# Patient Record
Sex: Male | Born: 1980 | Race: Black or African American | Hispanic: No | Marital: Single | Smoking: Current every day smoker
Health system: Southern US, Community
[De-identification: ages and names within clinical notes are randomized; demographics above are authoritative.]

## PROBLEM LIST (undated history)

## (undated) DIAGNOSIS — I10 Essential (primary) hypertension: Secondary | ICD-10-CM

## (undated) DIAGNOSIS — E785 Hyperlipidemia, unspecified: Secondary | ICD-10-CM

---

## 2005-11-24 ENCOUNTER — Emergency Department: Payer: Self-pay | Admitting: Emergency Medicine

## 2006-04-23 ENCOUNTER — Other Ambulatory Visit: Payer: Self-pay

## 2006-04-23 ENCOUNTER — Emergency Department: Payer: Self-pay | Admitting: Emergency Medicine

## 2006-07-05 ENCOUNTER — Emergency Department: Payer: Self-pay | Admitting: General Practice

## 2006-07-05 ENCOUNTER — Other Ambulatory Visit: Payer: Self-pay

## 2007-05-10 ENCOUNTER — Emergency Department: Payer: Self-pay | Admitting: Emergency Medicine

## 2007-05-13 ENCOUNTER — Other Ambulatory Visit: Payer: Self-pay

## 2007-05-13 ENCOUNTER — Emergency Department: Payer: Self-pay | Admitting: Emergency Medicine

## 2007-07-23 ENCOUNTER — Emergency Department: Payer: Self-pay | Admitting: Emergency Medicine

## 2007-11-20 ENCOUNTER — Emergency Department: Payer: Self-pay | Admitting: Emergency Medicine

## 2008-02-02 ENCOUNTER — Emergency Department: Payer: Self-pay | Admitting: Emergency Medicine

## 2008-05-18 ENCOUNTER — Emergency Department: Payer: Self-pay | Admitting: Unknown Physician Specialty

## 2008-10-26 ENCOUNTER — Emergency Department: Payer: Self-pay | Admitting: Unknown Physician Specialty

## 2011-04-17 ENCOUNTER — Emergency Department: Payer: Self-pay | Admitting: Emergency Medicine

## 2011-06-04 ENCOUNTER — Emergency Department: Payer: Self-pay | Admitting: *Deleted

## 2011-10-05 ENCOUNTER — Emergency Department: Payer: Self-pay | Admitting: Emergency Medicine

## 2011-10-05 LAB — BASIC METABOLIC PANEL
BUN: 10 mg/dL (ref 7–18)
Calcium, Total: 8.6 mg/dL (ref 8.5–10.1)
Chloride: 104 mmol/L (ref 98–107)
Creatinine: 0.97 mg/dL (ref 0.60–1.30)
EGFR (African American): >60
EGFR (Non-African Amer.): >60
Glucose: 85 mg/dL (ref 65–99)
Osmolality: 278 (ref 275–301)
Potassium: 4 mmol/L (ref 3.5–5.1)
Sodium: 140 mmol/L (ref 136–145)

## 2011-10-05 LAB — CBC
MCH: 29.5 pg (ref 26.0–34.0)
MCHC: 32.6 g/dL (ref 32.0–36.0)
MCV: 91 fL (ref 80–100)
Platelet: 202 10*3/uL (ref 150–440)
WBC: 11.5 10*3/uL — ABNORMAL HIGH (ref 3.8–10.6)

## 2011-10-05 LAB — HEPATIC FUNCTION PANEL A (ARMC)
Alkaline Phosphatase: 78 U/L (ref 50–136)
Bilirubin, Direct: 0.1 mg/dL (ref 0.00–0.20)
SGOT(AST): 22 U/L (ref 15–37)
Total Protein: 7.5 g/dL (ref 6.4–8.2)

## 2011-10-05 LAB — URINALYSIS, COMPLETE
Bilirubin,UR: NEGATIVE
Glucose,UR: NEGATIVE mg/dL (ref 0–75)
Nitrite: NEGATIVE
Squamous Epithelial: NONE SEEN
WBC UR: 2 /HPF (ref 0–5)

## 2011-10-05 LAB — TROPONIN I: Troponin-I: <0.02 ng/mL

## 2011-10-05 LAB — LIPASE, BLOOD: Lipase: 103 U/L (ref 73–393)

## 2012-05-31 ENCOUNTER — Emergency Department: Payer: Self-pay | Admitting: Emergency Medicine

## 2012-05-31 LAB — RAPID INFLUENZA A&B ANTIGENS

## 2012-06-01 ENCOUNTER — Emergency Department: Payer: Self-pay | Admitting: Emergency Medicine

## 2012-06-02 LAB — BETA STREP CULTURE(ARMC)

## 2012-09-16 ENCOUNTER — Emergency Department: Payer: Self-pay | Admitting: Emergency Medicine

## 2012-09-16 LAB — BASIC METABOLIC PANEL
Anion Gap: 4 — ABNORMAL LOW (ref 7–16)
BUN: 15 mg/dL (ref 7–18)
Creatinine: 0.87 mg/dL (ref 0.60–1.30)
EGFR (Non-African Amer.): >60
Glucose: 93 mg/dL (ref 65–99)
Osmolality: 280 (ref 275–301)

## 2012-09-16 LAB — CBC
MCH: 29.3 pg (ref 26.0–34.0)
MCHC: 34.1 g/dL (ref 32.0–36.0)
Platelet: 207 10*3/uL (ref 150–440)
RBC: 4.96 10*6/uL (ref 4.40–5.90)
RDW: 13.7 % (ref 11.5–14.5)
WBC: 10.2 10*3/uL (ref 3.8–10.6)

## 2012-09-17 LAB — CK TOTAL AND CKMB (NOT AT ARMC): CK, Total: 258 U/L — ABNORMAL HIGH (ref 35–232)

## 2012-09-17 LAB — TROPONIN I: Troponin-I: <0.02 ng/mL

## 2012-10-09 ENCOUNTER — Emergency Department: Payer: Self-pay | Admitting: Emergency Medicine

## 2013-01-08 ENCOUNTER — Emergency Department: Payer: Self-pay | Admitting: Emergency Medicine

## 2013-01-08 LAB — CBC
HCT: 45.5 % (ref 40.0–52.0)
MCHC: 33.9 g/dL (ref 32.0–36.0)
MCV: 88 fL (ref 80–100)
Platelet: 191 10*3/uL (ref 150–440)
RBC: 5.18 10*6/uL (ref 4.40–5.90)

## 2013-01-08 LAB — BASIC METABOLIC PANEL
Anion Gap: 4 — ABNORMAL LOW (ref 7–16)
BUN: 12 mg/dL (ref 7–18)
Chloride: 108 mmol/L — ABNORMAL HIGH (ref 98–107)
Co2: 27 mmol/L (ref 21–32)
EGFR (African American): >60
EGFR (Non-African Amer.): >60

## 2013-01-08 LAB — TROPONIN I: Troponin-I: <0.02 ng/mL

## 2013-06-20 ENCOUNTER — Emergency Department: Payer: Self-pay | Admitting: Emergency Medicine

## 2013-06-20 LAB — TROPONIN I: Troponin-I: <0.02 ng/mL

## 2013-06-20 LAB — URINALYSIS, COMPLETE
Bacteria: NONE SEEN
Bilirubin,UR: NEGATIVE
Blood: NEGATIVE
Glucose,UR: NEGATIVE mg/dL (ref 0–75)
Ketone: NEGATIVE
Leukocyte Esterase: NEGATIVE
Nitrite: NEGATIVE
PH: 6 (ref 4.5–8.0)
PROTEIN: NEGATIVE
Specific Gravity: 1.024 (ref 1.003–1.030)
Squamous Epithelial: NONE SEEN
WBC UR: <1 /HPF (ref 0–5)

## 2013-06-20 LAB — CBC WITH DIFFERENTIAL/PLATELET
Basophil #: 0.1 10*3/uL (ref 0.0–0.1)
Basophil %: 0.9 %
Eosinophil #: 0.5 10*3/uL (ref 0.0–0.7)
Eosinophil %: 5.3 %
HCT: 44.9 % (ref 40.0–52.0)
HGB: 14.9 g/dL (ref 13.0–18.0)
LYMPHS ABS: 3.4 10*3/uL (ref 1.0–3.6)
LYMPHS PCT: 35.9 %
MCH: 29.5 pg (ref 26.0–34.0)
MCHC: 33.3 g/dL (ref 32.0–36.0)
MCV: 89 fL (ref 80–100)
MONOS PCT: 12.4 %
Monocyte #: 1.2 x10 3/mm — ABNORMAL HIGH (ref 0.2–1.0)
NEUTROS ABS: 4.3 10*3/uL (ref 1.4–6.5)
Neutrophil %: 45.5 %
Platelet: 192 10*3/uL (ref 150–440)
RBC: 5.07 10*6/uL (ref 4.40–5.90)
RDW: 13.4 % (ref 11.5–14.5)
WBC: 9.4 10*3/uL (ref 3.8–10.6)

## 2013-06-20 LAB — COMPREHENSIVE METABOLIC PANEL
ALK PHOS: 101 U/L
ANION GAP: 4 — AB (ref 7–16)
Albumin: 3.8 g/dL (ref 3.4–5.0)
BUN: 12 mg/dL (ref 7–18)
Bilirubin,Total: 0.4 mg/dL (ref 0.2–1.0)
CALCIUM: 9 mg/dL (ref 8.5–10.1)
CHLORIDE: 103 mmol/L (ref 98–107)
CO2: 29 mmol/L (ref 21–32)
Creatinine: 1 mg/dL (ref 0.60–1.30)
EGFR (African American): >60
Glucose: 121 mg/dL — ABNORMAL HIGH (ref 65–99)
Osmolality: 273 (ref 275–301)
Potassium: 3.7 mmol/L (ref 3.5–5.1)
SGOT(AST): 24 U/L (ref 15–37)
SGPT (ALT): 36 U/L (ref 12–78)
Sodium: 136 mmol/L (ref 136–145)
TOTAL PROTEIN: 7.2 g/dL (ref 6.4–8.2)

## 2013-06-20 LAB — LIPASE, BLOOD: Lipase: 140 U/L (ref 73–393)

## 2014-03-23 ENCOUNTER — Emergency Department: Payer: Self-pay | Admitting: Emergency Medicine

## 2015-03-25 ENCOUNTER — Encounter: Payer: Self-pay | Admitting: Emergency Medicine

## 2015-03-25 ENCOUNTER — Emergency Department
Admission: EM | Admit: 2015-03-25 | Discharge: 2015-03-26 | Disposition: A | Discharge status: Home / Self Care | Payer: BLUE CROSS/BLUE SHIELD | Attending: Emergency Medicine | Admitting: Emergency Medicine

## 2015-03-25 DIAGNOSIS — K649 Unspecified hemorrhoids: Secondary | ICD-10-CM | POA: Diagnosis not present

## 2015-03-25 DIAGNOSIS — I10 Essential (primary) hypertension: Secondary | ICD-10-CM | POA: Diagnosis not present

## 2015-03-25 DIAGNOSIS — Z720 Tobacco use: Secondary | ICD-10-CM | POA: Diagnosis not present

## 2015-03-25 DIAGNOSIS — R109 Unspecified abdominal pain: Secondary | ICD-10-CM | POA: Diagnosis present

## 2015-03-25 HISTORY — DX: Essential (primary) hypertension: I10

## 2015-03-25 LAB — COMPREHENSIVE METABOLIC PANEL
ALT: 34 U/L (ref 17–63)
ANION GAP: 5 (ref 5–15)
AST: 23 U/L (ref 15–41)
Albumin: 4.5 g/dL (ref 3.5–5.0)
Alkaline Phosphatase: 74 U/L (ref 38–126)
BILIRUBIN TOTAL: 0.7 mg/dL (ref 0.3–1.2)
BUN: 13 mg/dL (ref 6–20)
CO2: 26 mmol/L (ref 22–32)
Calcium: 9 mg/dL (ref 8.9–10.3)
Chloride: 105 mmol/L (ref 101–111)
Creatinine, Ser: 0.94 mg/dL (ref 0.61–1.24)
GFR calc Af Amer: >60 mL/min (ref >60)
Glucose, Bld: 106 mg/dL — ABNORMAL HIGH (ref 65–99)
POTASSIUM: 3.9 mmol/L (ref 3.5–5.1)
Sodium: 136 mmol/L (ref 135–145)
TOTAL PROTEIN: 7.6 g/dL (ref 6.5–8.1)

## 2015-03-25 LAB — URINALYSIS COMPLETE WITH MICROSCOPIC (ARMC ONLY)
Bacteria, UA: NONE SEEN
Bilirubin Urine: NEGATIVE
GLUCOSE, UA: NEGATIVE mg/dL
Hgb urine dipstick: NEGATIVE
KETONES UR: NEGATIVE mg/dL
Leukocytes, UA: NEGATIVE
Nitrite: NEGATIVE
PROTEIN: NEGATIVE mg/dL
SQUAMOUS EPITHELIAL / LPF: NONE SEEN
Specific Gravity, Urine: 1.02 (ref 1.005–1.030)
pH: 6 (ref 5.0–8.0)

## 2015-03-25 LAB — CBC
HCT: 47.4 % (ref 40.0–52.0)
Hemoglobin: 15.9 g/dL (ref 13.0–18.0)
MCH: 29.4 pg (ref 26.0–34.0)
MCHC: 33.6 g/dL (ref 32.0–36.0)
MCV: 87.6 fL (ref 80.0–100.0)
PLATELETS: 190 10*3/uL (ref 150–440)
RBC: 5.41 MIL/uL (ref 4.40–5.90)
RDW: 13.5 % (ref 11.5–14.5)
WBC: 9.8 10*3/uL (ref 3.8–10.6)

## 2015-03-25 LAB — LIPASE, BLOOD: Lipase: 28 U/L (ref 11–51)

## 2015-03-25 NOTE — ED Notes (Signed)
Patient reports left lower abdominal pain since Thursday.  Reports noticed blood in stool x1 yesterday.

## 2015-03-26 MED ORDER — LIDOCAINE HCL 2 % EX GEL
1.0000 "application " | Freq: Once | CUTANEOUS | Status: AC
  Administered 2015-03-26: 1 via TOPICAL

## 2015-03-26 NOTE — ED Notes (Signed)
Documentation on downtime forms.

## 2015-03-26 NOTE — ED Provider Notes (Signed)
[facility] Emergency Department Provider Note  ____________________________________________  Time seen: Approximately 0036 AM  I have reviewed the triage vital signs and the nursing notes.   HISTORY  Chief Complaint Abdominal Pain    HPI Jerry Anderson is a 34 y.o. male who comes into the hospital today with pain in his rectum. The patient reports that it started yesterday. He went to use the bathroom and noticed that he had blood in his stool. The patient reports that he has a little pain in his left side. He reports that he had to strain to use the bathroom. He denies a history of hemorrhoids. The patient is a bathroom and didn't notice any blood today but reports these having some worse pain. He did not take anything for pain at home. He rates his pain as a 10 out of 10 in intensity in his rectum and a 6-7 out of 10 in intensity and is left side. The patient decided to come in for evaluation as he was unsure what going on. The patient denies any diarrhea and does not think he is constipated. He does not have any chest pain or shortness of breath no fever or vomiting.   Past Medical History  Diagnosis Date  . Hypertension     There are no active problems to display for this patient.   History reviewed. No pertinent past surgical history.  No current outpatient prescriptions on file.  Allergies Review of patient's allergies indicates no known allergies.  No family history on file.  Social History Social History  Substance Use Topics  . Smoking status: Current Every Day Smoker -- 0.50 packs/day    Types: Cigarettes  . Smokeless tobacco: Never Used  . Alcohol Use: Yes    Review of Systems Constitutional: No fever/chills Eyes: No visual changes. ENT: No sore throat. Cardiovascular: Denies chest pain. Respiratory: Denies shortness of breath. Gastrointestinal: abdominal pain and rectal pain  No nausea, no vomiting.  No diarrhea.  No  constipation. Genitourinary: Negative for dysuria. Musculoskeletal: Negative for back pain. Skin: Negative for rash. Neurological: Negative for headaches, focal weakness or numbness.  10-point ROS otherwise negative.  ____________________________________________   PHYSICAL EXAM:  VITAL SIGNS: ED Triage Vitals  Enc Vitals Group     BP 03/25/15 2307 168/98 mmHg     Pulse Rate 03/25/15 2307 84     Resp 03/25/15 2307 18     Temp 03/25/15 2307 98.6 F (37 C)     Temp Source 03/25/15 2307 Oral     SpO2 03/25/15 2307 100 %     Weight 03/25/15 2307 225 lb (102.059 kg)     Height 03/25/15 2307  (1.702 m)     Head Cir --      Peak Flow --      Pain Score 03/25/15 2308 6     Pain Loc --      Pain Edu? --      Excl. in GC? --     Constitutional: Alert and oriented. Well appearing and in no acute distress. Eyes: Conjunctivae are normal. PERRL. EOMI. Head: Atraumatic. Nose: No congestion/rhinnorhea. Mouth/Throat: Mucous membranes are moist.  Oropharynx non-erythematous. Neck: No stridor.  Cardiovascular: Normal rate, regular rhythm. Grossly normal heart sounds.  Good peripheral circulation. Respiratory: Normal respiratory effort.  No retractions. Lungs CTAB. Gastrointestinal: Soft and nontender. No distention. Positive bowel sounds Rectal: hemrrohoids noted on rectal exam. Patient refused DRE Musculoskeletal: No lower extremity tenderness nor edema.   Neurologic:  Normal speech and language. No gross focal neurologic deficits are appreciated.  Skin:  Skin is warm, dry and intact. Psychiatric: Mood and affect are normal.   ____________________________________________   LABS (all labs ordered are listed, but only abnormal results are displayed)  Labs Reviewed  COMPREHENSIVE METABOLIC PANEL - Abnormal; Notable for the following:    Glucose, Bld 106 (*)    All other components within normal limits  URINALYSIS COMPLETEWITH MICROSCOPIC ([facility] ONLY) - Abnormal; Notable for  the following:    Color, Urine YELLOW (*)    APPearance CLEAR (*)    All other components within normal limits  LIPASE, BLOOD  CBC   ____________________________________________  EKG  none ____________________________________________  RADIOLOGY  none ____________________________________________   PROCEDURES  Procedure(s) performed: None  Critical Care performed: No  ____________________________________________   INITIAL IMPRESSION / ASSESSMENT AND PLAN / ED COURSE  Pertinent labs & imaging results that were available during my care of the patient were reviewed by me and considered in my medical decision making (see chart for details).  This is a 34 year old male who comes in today with some rectal pain. The patient reports he was straining to have a bowel movement and does appear to have hemorrhoids on exam. The patient would not allow me to do a digital rectal exam to check for blood because he did not feel it was necessary. I informed the patient of the things to help with his hemorrhoids such as over-the-counter Preparation H cream, Metamucil, Colace and other stool softening and bulking agents. The patient did get some lidocaine jelly to his rectum to help with his pain and he will be discharged to home. The patient reports that his abdominal pain is minimal and does not what he is concerned about at this time. ____________________________________________   FINAL CLINICAL IMPRESSION(S) / ED DIAGNOSES  Final diagnoses:  Hemorrhoids, unspecified hemorrhoid type      Rebecka ApleyAllison P Webster, MD 03/26/15 0825

## 2016-04-23 ENCOUNTER — Emergency Department: Payer: BLUE CROSS/BLUE SHIELD

## 2016-04-23 ENCOUNTER — Emergency Department
Admission: EM | Admit: 2016-04-23 | Discharge: 2016-04-23 | Disposition: A | Discharge status: Home / Self Care | Payer: BLUE CROSS/BLUE SHIELD | Attending: Emergency Medicine | Admitting: Emergency Medicine

## 2016-04-23 ENCOUNTER — Encounter: Payer: Self-pay | Admitting: *Deleted

## 2016-04-23 DIAGNOSIS — J069 Acute upper respiratory infection, unspecified: Secondary | ICD-10-CM | POA: Insufficient documentation

## 2016-04-23 DIAGNOSIS — I10 Essential (primary) hypertension: Secondary | ICD-10-CM | POA: Insufficient documentation

## 2016-04-23 DIAGNOSIS — R1012 Left upper quadrant pain: Secondary | ICD-10-CM | POA: Insufficient documentation

## 2016-04-23 DIAGNOSIS — R05 Cough: Secondary | ICD-10-CM | POA: Diagnosis present

## 2016-04-23 DIAGNOSIS — F17210 Nicotine dependence, cigarettes, uncomplicated: Secondary | ICD-10-CM | POA: Insufficient documentation

## 2016-04-23 LAB — COMPREHENSIVE METABOLIC PANEL
ALBUMIN: 4.4 g/dL (ref 3.5–5.0)
ALK PHOS: 75 U/L (ref 38–126)
ALT: 40 U/L (ref 17–63)
AST: 27 U/L (ref 15–41)
Anion gap: 6 (ref 5–15)
BILIRUBIN TOTAL: 0.5 mg/dL (ref 0.3–1.2)
BUN: 11 mg/dL (ref 6–20)
CO2: 27 mmol/L (ref 22–32)
Calcium: 9.1 mg/dL (ref 8.9–10.3)
Chloride: 107 mmol/L (ref 101–111)
Creatinine, Ser: 0.89 mg/dL (ref 0.61–1.24)
GFR calc Af Amer: >60 mL/min (ref >60)
GFR calc non Af Amer: >60 mL/min (ref >60)
GLUCOSE: 101 mg/dL — AB (ref 65–99)
POTASSIUM: 3.7 mmol/L (ref 3.5–5.1)
SODIUM: 140 mmol/L (ref 135–145)
TOTAL PROTEIN: 7.4 g/dL (ref 6.5–8.1)

## 2016-04-23 LAB — URINALYSIS, COMPLETE (UACMP) WITH MICROSCOPIC
BACTERIA UA: NONE SEEN
Bilirubin Urine: NEGATIVE
Glucose, UA: NEGATIVE mg/dL
HGB URINE DIPSTICK: NEGATIVE
Ketones, ur: NEGATIVE mg/dL
Leukocytes, UA: NEGATIVE
NITRITE: NEGATIVE
PROTEIN: NEGATIVE mg/dL
SPECIFIC GRAVITY, URINE: 1.017 (ref 1.005–1.030)
SQUAMOUS EPITHELIAL / LPF: NONE SEEN
pH: 6 (ref 5.0–8.0)

## 2016-04-23 LAB — CBC
HEMATOCRIT: 42.3 % (ref 40.0–52.0)
Hemoglobin: 14.6 g/dL (ref 13.0–18.0)
MCH: 30.2 pg (ref 26.0–34.0)
MCHC: 34.5 g/dL (ref 32.0–36.0)
MCV: 87.4 fL (ref 80.0–100.0)
Platelets: 182 10*3/uL (ref 150–440)
RBC: 4.84 MIL/uL (ref 4.40–5.90)
RDW: 13.6 % (ref 11.5–14.5)
WBC: 8.9 10*3/uL (ref 3.8–10.6)

## 2016-04-23 LAB — LIPASE, BLOOD: Lipase: 25 U/L (ref 11–51)

## 2016-04-23 MED ORDER — TRAMADOL HCL 50 MG PO TABS: 50.0000 mg | ORAL_TABLET | Freq: Four times a day (QID) | ORAL | 0 refills | Status: AC | PRN

## 2016-04-23 MED ORDER — PREDNISONE 20 MG PO TABS: 40.0000 mg | ORAL_TABLET | Freq: Every day | ORAL | 0 refills | Status: DC

## 2016-04-23 MED ORDER — HYDROCHLOROTHIAZIDE 25 MG PO TABS: 25.0000 mg | ORAL_TABLET | Freq: Every day | ORAL | 2 refills | Status: DC
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
## 2016-04-23 NOTE — ED Notes (Signed)
Discharge instructions reviewed with patient. Patient verbalized understanding. Patient ambulated to lobby without difficulty.   

## 2016-12-08 ENCOUNTER — Emergency Department: Payer: BLUE CROSS/BLUE SHIELD

## 2016-12-08 ENCOUNTER — Encounter: Payer: Self-pay | Admitting: Emergency Medicine

## 2016-12-08 DIAGNOSIS — R079 Chest pain, unspecified: Secondary | ICD-10-CM | POA: Insufficient documentation

## 2016-12-08 DIAGNOSIS — R61 Generalized hyperhidrosis: Secondary | ICD-10-CM | POA: Insufficient documentation

## 2016-12-08 DIAGNOSIS — R112 Nausea with vomiting, unspecified: Secondary | ICD-10-CM | POA: Diagnosis not present

## 2016-12-08 DIAGNOSIS — Z5321 Procedure and treatment not carried out due to patient leaving prior to being seen by health care provider: Secondary | ICD-10-CM | POA: Insufficient documentation

## 2016-12-08 LAB — CBC
HCT: 45.2 % (ref 40.0–52.0)
Hemoglobin: 15.1 g/dL (ref 13.0–18.0)
MCH: 29.2 pg (ref 26.0–34.0)
MCHC: 33.4 g/dL (ref 32.0–36.0)
MCV: 87.3 fL (ref 80.0–100.0)
PLATELETS: 211 10*3/uL (ref 150–440)
RBC: 5.18 MIL/uL (ref 4.40–5.90)
RDW: 13.7 % (ref 11.5–14.5)
WBC: 12.2 10*3/uL — AB (ref 3.8–10.6)

## 2016-12-08 LAB — BASIC METABOLIC PANEL
Anion gap: 10 (ref 5–15)
BUN: 9 mg/dL (ref 6–20)
CO2: 25 mmol/L (ref 22–32)
CREATININE: 0.91 mg/dL (ref 0.61–1.24)
Calcium: 9.2 mg/dL (ref 8.9–10.3)
Chloride: 104 mmol/L (ref 101–111)
GFR calc Af Amer: >60 mL/min (ref >60)
GLUCOSE: 88 mg/dL (ref 65–99)
Potassium: 3.5 mmol/L (ref 3.5–5.1)
SODIUM: 139 mmol/L (ref 135–145)

## 2016-12-08 LAB — TROPONIN I: Troponin I: <0.03 ng/mL (ref <0.03)

## 2016-12-08 NOTE — ED Notes (Signed)
Patient to stat desk. Patient updated on wait time.

## 2016-12-08 NOTE — ED Triage Notes (Signed)
Pt states left sided chest pain that began approx one hour pta. Pt states pain radiates to left anterior shoulder, accompanied by nausea, emesis x1 and sweating. Pt appears in no acute distress in triage. Pt states pain is worse with inspiration.

## 2016-12-09 ENCOUNTER — Emergency Department
Admission: EM | Admit: 2016-12-09 | Discharge: 2016-12-09 | Disposition: A | Discharge status: Home / Self Care | Payer: BLUE CROSS/BLUE SHIELD | Attending: Emergency Medicine | Admitting: Emergency Medicine

## 2016-12-10 ENCOUNTER — Telehealth: Payer: Self-pay | Admitting: Emergency Medicine

## 2016-12-10 NOTE — Telephone Encounter (Signed)
Called patient due to lwot to inquire about condition and follow up plans.; Busy signal.

## 2016-12-23 ENCOUNTER — Encounter: Payer: Self-pay | Admitting: Medical Oncology

## 2016-12-23 ENCOUNTER — Emergency Department
Admission: EM | Admit: 2016-12-23 | Discharge: 2016-12-23 | Disposition: A | Discharge status: Home / Self Care | Payer: BLUE CROSS/BLUE SHIELD | Attending: Emergency Medicine | Admitting: Emergency Medicine

## 2016-12-23 ENCOUNTER — Emergency Department: Payer: BLUE CROSS/BLUE SHIELD

## 2016-12-23 DIAGNOSIS — R42 Dizziness and giddiness: Secondary | ICD-10-CM | POA: Insufficient documentation

## 2016-12-23 DIAGNOSIS — F17210 Nicotine dependence, cigarettes, uncomplicated: Secondary | ICD-10-CM | POA: Diagnosis not present

## 2016-12-23 DIAGNOSIS — I10 Essential (primary) hypertension: Secondary | ICD-10-CM | POA: Insufficient documentation

## 2016-12-23 DIAGNOSIS — Z79899 Other long term (current) drug therapy: Secondary | ICD-10-CM | POA: Insufficient documentation

## 2016-12-23 LAB — COMPREHENSIVE METABOLIC PANEL
ALT: 22 U/L (ref 17–63)
ANION GAP: 10 (ref 5–15)
AST: 21 U/L (ref 15–41)
Albumin: 4.4 g/dL (ref 3.5–5.0)
Alkaline Phosphatase: 70 U/L (ref 38–126)
BILIRUBIN TOTAL: 1.2 mg/dL (ref 0.3–1.2)
BUN: 13 mg/dL (ref 6–20)
CALCIUM: 9.8 mg/dL (ref 8.9–10.3)
CO2: 26 mmol/L (ref 22–32)
Chloride: 103 mmol/L (ref 101–111)
Creatinine, Ser: 0.84 mg/dL (ref 0.61–1.24)
GFR calc non Af Amer: >60 mL/min (ref >60)
Glucose, Bld: 106 mg/dL — ABNORMAL HIGH (ref 65–99)
Potassium: 3.6 mmol/L (ref 3.5–5.1)
Sodium: 139 mmol/L (ref 135–145)
Total Protein: 7.6 g/dL (ref 6.5–8.1)

## 2016-12-23 LAB — CBC WITH DIFFERENTIAL/PLATELET
Basophils Absolute: 0.1 10*3/uL (ref 0–0.1)
Basophils Relative: 2 %
Eosinophils Absolute: 0.2 10*3/uL (ref 0–0.7)
Eosinophils Relative: 3 %
HCT: 45.3 % (ref 40.0–52.0)
HEMOGLOBIN: 15.5 g/dL (ref 13.0–18.0)
LYMPHS ABS: 2.3 10*3/uL (ref 1.0–3.6)
LYMPHS PCT: 30 %
MCH: 29.5 pg (ref 26.0–34.0)
MCHC: 34.3 g/dL (ref 32.0–36.0)
MCV: 86 fL (ref 80.0–100.0)
MONOS PCT: 9 %
Monocytes Absolute: 0.7 10*3/uL (ref 0.2–1.0)
NEUTROS PCT: 56 %
Neutro Abs: 4.4 10*3/uL (ref 1.4–6.5)
Platelets: 205 10*3/uL (ref 150–440)
RBC: 5.27 MIL/uL (ref 4.40–5.90)
RDW: 13.7 % (ref 11.5–14.5)
WBC: 7.7 10*3/uL (ref 3.8–10.6)

## 2016-12-23 LAB — TROPONIN I

## 2016-12-23 MED ORDER — ONDANSETRON HCL 4 MG/2ML IJ SOLN
4.0000 mg | Freq: Once | INTRAMUSCULAR | Status: AC
  Administered 2016-12-23: 4 mg via INTRAVENOUS
  Filled 2016-12-23: qty 2

## 2016-12-23 MED ORDER — SODIUM CHLORIDE 0.9 % IV SOLN
Freq: Once | INTRAVENOUS | Status: AC
Start: 2016-12-23 — End: 2016-12-23
  Administered 2016-12-23: 12:00:00 via INTRAVENOUS

## 2016-12-23 MED ORDER — HYDROCHLOROTHIAZIDE 25 MG PO TABS: 25.0000 mg | ORAL_TABLET | Freq: Every day | ORAL | 1 refills | Status: DC

## 2016-12-23 MED ORDER — ONDANSETRON 4 MG PO TBDP: 4.0000 mg | ORAL_TABLET | Freq: Three times a day (TID) | ORAL | 0 refills | Status: DC | PRN

## 2016-12-23 NOTE — ED Provider Notes (Addendum)
[facility] Emergency Department Provider Note       Time seen: ----------------------------------------- 11:18 AM on 12/23/2016 -----------------------------------------     I have reviewed the triage vital signs and the nursing notes.   HISTORY   Chief Complaint Dizziness    HPI Jerry Anderson is a 36 y.o. male who presents to the ED for dizziness and seeing black spots. Patient states he was also having some chest pressure. Patient states his been out of his blood pressure medicines for a couple months, he had been taking hydrochlorothiazide. Patient has been vomiting, denies other complaints at this time.   Past Medical History:  Diagnosis Date  . Hypertension     There are no active problems to display for this patient.   No past surgical history on file.  Allergies Patient has no known allergies.  Social History Social History  Substance Use Topics  . Smoking status: Current Every Day Smoker    Packs/day: 0.50    Types: Cigarettes  . Smokeless tobacco: Never Used  . Alcohol use Yes    Review of Systems Constitutional: Negative for fever. Cardiovascular: Negative for chest pain. Respiratory: Negative for shortness of breath. Gastrointestinal: Negative for abdominal pain,Positive for vomiting Genitourinary: Negative for dysuria. Musculoskeletal: Negative for back pain. Skin: Negative for rash. Neurological: Negative for headaches, focal weakness or numbness. Positive for dizziness  All systems negative/normal/unremarkable except as stated in the HPI  ____________________________________________   PHYSICAL EXAM:  VITAL SIGNS: ED Triage Vitals  Enc Vitals Group     BP 12/23/16 1059 (!) 172/97     Pulse Rate 12/23/16 1059 97     Resp 12/23/16 1059 18     Temp 12/23/16 1059 99.1 F (37.3 C)     Temp Source 12/23/16 1059 Oral     SpO2 12/23/16 1059 96 %     Weight 12/23/16 1059 225 lb (102.1 kg)     Height 12/23/16  1059 5\' 7"  (1.702 m)     Head Circumference --      Peak Flow --      Pain Score 12/23/16 1058 9     Pain Loc --      Pain Edu? --      Excl. in GC? --    Constitutional: Alert and oriented. Well appearing and in no distress. Eyes: Conjunctivae are normal. Normal extraocular movements. ENT   Head: Normocephalic and atraumatic.   Nose: No congestion/rhinnorhea.   Mouth/Throat: Mucous membranes are moist.   Neck: No stridor. Cardiovascular: Normal rate, regular rhythm. No murmurs, rubs, or gallops. Respiratory: Normal respiratory effort without tachypnea nor retractions. Breath sounds are clear and equal bilaterally. No wheezes/rales/rhonchi. Gastrointestinal: Soft and nontender. Normal bowel sounds Musculoskeletal: Nontender with normal range of motion in extremities. No lower extremity tenderness nor edema. Neurologic:  Normal speech and language. No gross focal neurologic deficits are appreciated.  Skin:  Skin is warm, dry and intact. No rash noted. Psychiatric: Mood and affect are normal. Speech and behavior are normal.  ____________________________________________  EKG: Interpreted by me.Sinus rhythm rate 87 bpm, normal PR interval, normal QRS, normal QT. Nonspecific T-wave changes Which are old  ____________________________________________  ED COURSE:  Pertinent labs & imaging results that were available during my care of the patient were reviewed by me and considered in my medical decision making (see chart for details). Patient presents for dizziness, we will assess with labs and imaging as indicated.   Procedures ____________________________________________   LABS (pertinent positives/negatives)  Labs Reviewed  COMPREHENSIVE METABOLIC PANEL - Abnormal; Notable for the following:       Result Value   Glucose, Bld 106 (*)    All other components within normal limits  CBC WITH DIFFERENTIAL/PLATELET  TROPONIN I  CBG MONITORING, ED    RADIOLOGY  Chest  x-ray  IMPRESSION: No active cardiopulmonary disease.  ____________________________________________  FINAL ASSESSMENT AND PLAN  Dizziness  Plan: Patient's labs and imaging were dictated above. Patient had presented for dizziness of uncertain etiology. He was given fluids as well as Zofran. I will prescribe nausea medicine for him and restart him on blood pressure medication. He'll be referred to primary care for outpatient follow-up.   Emily FilbertWilliams, Jonathan E, MD   Note: This note was generated in part or whole with voice recognition software. Voice recognition is usually quite accurate but there are transcription errors that can and very often do occur. I apologize for any typographical errors that were not detected and corrected.     Emily FilbertWilliams, Jonathan E, MD 12/23/16 [id number]    Emily FilbertWilliams, Jonathan E, MD 12/23/16 [phone]

## 2016-12-23 NOTE — ED Triage Notes (Signed)
Pt reports waking up this am feeling dizzy and seeing "black spots". Pt states that he is also having some chest pressure. Pt in NAD at this time. States that he has been out of his BP meds for a couple of months.

## 2017-04-19 ENCOUNTER — Emergency Department: Payer: BLUE CROSS/BLUE SHIELD

## 2017-04-19 ENCOUNTER — Emergency Department
Admission: EM | Admit: 2017-04-19 | Discharge: 2017-04-19 | Disposition: A | Discharge status: Home / Self Care | Payer: BLUE CROSS/BLUE SHIELD | Attending: Emergency Medicine | Admitting: Emergency Medicine

## 2017-04-19 DIAGNOSIS — R51 Headache: Secondary | ICD-10-CM | POA: Diagnosis not present

## 2017-04-19 DIAGNOSIS — I10 Essential (primary) hypertension: Secondary | ICD-10-CM | POA: Diagnosis not present

## 2017-04-19 DIAGNOSIS — R42 Dizziness and giddiness: Secondary | ICD-10-CM | POA: Diagnosis present

## 2017-04-19 DIAGNOSIS — F17210 Nicotine dependence, cigarettes, uncomplicated: Secondary | ICD-10-CM | POA: Insufficient documentation

## 2017-04-19 DIAGNOSIS — Z79899 Other long term (current) drug therapy: Secondary | ICD-10-CM | POA: Diagnosis not present

## 2017-04-19 DIAGNOSIS — R519 Headache, unspecified: Secondary | ICD-10-CM

## 2017-04-19 LAB — DIFFERENTIAL
BASOS ABS: 0.1 10*3/uL (ref 0–0.1)
BASOS PCT: 1 %
EOS ABS: 0.5 10*3/uL (ref 0–0.7)
EOS PCT: 5 %
LYMPHS ABS: 3.2 10*3/uL (ref 1.0–3.6)
Lymphocytes Relative: 35 %
MONO ABS: 0.8 10*3/uL (ref 0.2–1.0)
MONOS PCT: 9 %
Neutro Abs: 4.5 10*3/uL (ref 1.4–6.5)
Neutrophils Relative %: 50 %

## 2017-04-19 LAB — PROTIME-INR
INR: 0.87
Prothrombin Time: 11.7 seconds (ref 11.4–15.2)

## 2017-04-19 LAB — CBC
HEMATOCRIT: 46.4 % (ref 40.0–52.0)
HEMOGLOBIN: 15.6 g/dL (ref 13.0–18.0)
MCH: 29.7 pg (ref 26.0–34.0)
MCHC: 33.7 g/dL (ref 32.0–36.0)
MCV: 88 fL (ref 80.0–100.0)
Platelets: 189 10*3/uL (ref 150–440)
RBC: 5.27 MIL/uL (ref 4.40–5.90)
RDW: 13.4 % (ref 11.5–14.5)
WBC: 9.1 10*3/uL (ref 3.8–10.6)

## 2017-04-19 LAB — COMPREHENSIVE METABOLIC PANEL
ALT: 41 U/L (ref 17–63)
ANION GAP: 11 (ref 5–15)
AST: 24 U/L (ref 15–41)
Albumin: 4.5 g/dL (ref 3.5–5.0)
Alkaline Phosphatase: 81 U/L (ref 38–126)
BILIRUBIN TOTAL: 0.7 mg/dL (ref 0.3–1.2)
BUN: 17 mg/dL (ref 6–20)
CHLORIDE: 103 mmol/L (ref 101–111)
CO2: 27 mmol/L (ref 22–32)
Calcium: 9.7 mg/dL (ref 8.9–10.3)
Creatinine, Ser: 0.94 mg/dL (ref 0.61–1.24)
Glucose, Bld: 109 mg/dL — ABNORMAL HIGH (ref 65–99)
POTASSIUM: 3.8 mmol/L (ref 3.5–5.1)
Sodium: 141 mmol/L (ref 135–145)
TOTAL PROTEIN: 7.7 g/dL (ref 6.5–8.1)

## 2017-04-19 LAB — APTT: APTT: 24 s (ref 24–36)

## 2017-04-19 LAB — TROPONIN I

## 2017-04-19 MED ORDER — PROCHLORPERAZINE EDISYLATE 5 MG/ML IJ SOLN
10.0000 mg | Freq: Once | INTRAMUSCULAR | Status: AC
  Administered 2017-04-19: 10 mg via INTRAVENOUS
  Filled 2017-04-19: qty 2

## 2017-04-19 MED ORDER — PROCHLORPERAZINE MALEATE 10 MG PO TABS: 10.0000 mg | ORAL_TABLET | Freq: Four times a day (QID) | ORAL | 0 refills | Status: DC | PRN

## 2017-04-19 NOTE — Discharge Instructions (Signed)
Please seek medical attention for any high fevers, chest pain, shortness of breath, change in behavior, persistent vomiting, bloody stool or any other new or concerning symptoms.  

## 2017-04-19 NOTE — ED Triage Notes (Signed)
Pt reports getting "swimmy" headed.  States he has a history of high blood pressure.  Denies history of dizziness.  Pt states this started approx 2 hours ago.

## 2017-04-19 NOTE — ED Provider Notes (Signed)
[facility] Emergency Department Provider Note   ____________________________________________   I have reviewed the triage vital signs and the nursing notes.   HISTORY  Chief Complaint Headache and dizziness  History limited by: Not Limited   HPI Jerry Anderson is a 36 y.o. male who presents to the emergency department today with primary concern for headache and dizziness   LOCATION:posterior head DURATION:2 hours TIMING: started suddenly SEVERITY: severe QUALITY: feel like things are moving around him CONTEXT: patient denies any recent trauma to his head. The patient states that he started with headache in the posterior part of his head. He then developed the dizziness. Denies similar symptoms in the past.  MODIFYING FACTORS: worse with movement. ASSOCIATED SYMPTOMS: no vomiting.   Per medical record review patient has a history of hypertension.  Past Medical History:  Diagnosis Date  . Hypertension     There are no active problems to display for this patient.   History reviewed. No pertinent surgical history.  Prior to Admission medications   Medication Sig Start Date End Date Taking? Authorizing Provider  hydrochlorothiazide (HYDRODIURIL) 25 MG tablet Take 1 tablet (25 mg total) by mouth daily. Patient not taking: Reported on 12/23/2016 04/23/16   Minna AntisPaduchowski, Kevin, MD  hydrochlorothiazide (HYDRODIURIL) 25 MG tablet Take 1 tablet (25 mg total) by mouth daily. 12/23/16   Emily FilbertWilliams, Jonathan E, MD  ondansetron (ZOFRAN ODT) 4 MG disintegrating tablet Take 1 tablet (4 mg total) by mouth every 8 (eight) hours as needed for nausea or vomiting. 12/23/16   Emily FilbertWilliams, Jonathan E, MD  predniSONE (DELTASONE) 20 MG tablet Take 2 tablets (40 mg total) by mouth daily. Patient not taking: Reported on 12/23/2016 04/23/16   Minna AntisPaduchowski, Kevin, MD  traMADol (ULTRAM) 50 MG tablet Take 1 tablet (50 mg total) by mouth every 6 (six) hours as needed. Patient not taking:  Reported on 12/23/2016 04/23/16 04/23/17  Minna AntisPaduchowski, Kevin, MD    Allergies Patient has no known allergies.  No family history on file.  Social History Social History   Tobacco Use  . Smoking status: Current Every Day Smoker    Packs/day: 0.50    Types: Cigarettes  . Smokeless tobacco: Never Used  Substance Use Topics  . Alcohol use: Yes  . Drug use: No    Review of Systems Constitutional: No fever/chills Eyes: Positive for room spinning. ENT: No sore throat. Cardiovascular: Denies chest pain. Respiratory: Denies shortness of breath. Gastrointestinal: No abdominal pain.  No nausea, no vomiting.  No diarrhea.   Genitourinary: Negative for dysuria. Musculoskeletal: Negative for back pain. Skin: Negative for rash. Neurological: Positive for headache. ____________________________________________   PHYSICAL EXAM:  VITAL SIGNS: ED Triage Vitals  Enc Vitals Group     BP 04/19/17 1851 (!) 165/102     Pulse Rate 04/19/17 1851 79     Resp 04/19/17 1851 18     Temp 04/19/17 1851 99 F (37.2 C)     Temp Source 04/19/17 1851 Oral     SpO2 04/19/17 1851 100 %     Weight 04/19/17 1852 225 lb (102.1 kg)     Height --      Head Circumference --      Peak Flow --      Pain Score 04/19/17 1850 9   Constitutional: Alert and oriented. Well appearing and in no distress. Eyes: Conjunctivae are normal.  ENT   Head: Normocephalic and atraumatic.   Nose: No congestion/rhinnorhea.   Mouth/Throat: Mucous membranes are moist.  Neck: No stridor. Hematological/Lymphatic/Immunilogical: No cervical lymphadenopathy. Cardiovascular: Normal rate, regular rhythm.  No murmurs, rubs, or gallops. Respiratory: Normal respiratory effort without tachypnea nor retractions. Breath sounds are clear and equal bilaterally. No wheezes/rales/rhonchi. Gastrointestinal: Soft and non tender. No rebound. No guarding.  Genitourinary: Deferred Musculoskeletal: Normal range of motion in all  extremities. No lower extremity edema. Neurologic:  Normal speech and language. Face symmetric. No nystagmus appreciated. Strength 5/5 in upper and lower extremities. Finger to nose normal. No gross focal neurologic deficits are appreciated.  Skin:  Skin is warm, dry and intact. No rash noted. Psychiatric: Mood and affect are normal. Speech and behavior are normal. Patient exhibits appropriate insight and judgment.  ____________________________________________    LABS (pertinent positives/negatives)  PT wnl PTT wnl CBC wnl CMP wnl except glu 109 Trop <0.03  ____________________________________________   EKG  I, Phineas SemenGraydon Yasira Engelson, attending physician, personally viewed and interpreted this EKG  EKG Time: 1849 Rate: 81 Rhythm: normal sinus rhythm Axis: normal Intervals: qtc 420 QRS: narrow ST changes: no st elevation, t wave inversion III, aVF Impression: abnormal ekg   ____________________________________________    RADIOLOGY  CT head Negative  I, Aleksey Newbern, personally discussed these images and results by phone with the on-call radiologist and used this discussion as part of my medical decision making.   ____________________________________________   PROCEDURES  Procedures  ____________________________________________   INITIAL IMPRESSION / ASSESSMENT AND PLAN / ED COURSE  Pertinent labs & imaging results that were available during my care of the patient were reviewed by me and considered in my medical decision making (see chart for details).  Patient presented to the emergency department today because of concerns for bad headache and dizziness.  Differential would include intracranial bleed, migraine, vertigo, infection, vertebral artery dissection amongst other etiologies.  Blood work and CT scan without concerning findings.  Patient was given Compazine with good resolution of the headache in the dizziness.  At this point I doubt stroke.  Given  resolution of symptoms I think likely secondary to headache.  Unclear etiology of the headache.  Did however discuss with patient return precautions.  Will give patient prescription for Compazine.    ____________________________________________   FINAL CLINICAL IMPRESSION(S) / ED DIAGNOSES  Final diagnoses:  Dizziness  Bad headache     Note: This dictation was prepared with Dragon dictation. Any transcriptional errors that result from this process are unintentional     Phineas SemenGoodman, Madalena Kesecker, MD 04/19/17 2111

## 2017-04-19 NOTE — ED Notes (Signed)
Spoke with Dr. Derrill KayGoodman, okay to call code stroke on patient.  Pt states this is the worst headache of his life, onset 2 hours ago and states he has never had dizziness in past.

## 2018-08-22 ENCOUNTER — Emergency Department: Payer: BLUE CROSS/BLUE SHIELD

## 2018-08-22 ENCOUNTER — Other Ambulatory Visit: Payer: Self-pay

## 2018-08-22 ENCOUNTER — Emergency Department
Admission: EM | Admit: 2018-08-22 | Discharge: 2018-08-22 | Disposition: A | Discharge status: Home / Self Care | Payer: BLUE CROSS/BLUE SHIELD | Attending: Emergency Medicine | Admitting: Emergency Medicine

## 2018-08-22 ENCOUNTER — Encounter: Payer: Self-pay | Admitting: Emergency Medicine

## 2018-08-22 DIAGNOSIS — F17210 Nicotine dependence, cigarettes, uncomplicated: Secondary | ICD-10-CM | POA: Insufficient documentation

## 2018-08-22 DIAGNOSIS — R42 Dizziness and giddiness: Secondary | ICD-10-CM | POA: Diagnosis not present

## 2018-08-22 DIAGNOSIS — R0789 Other chest pain: Secondary | ICD-10-CM | POA: Diagnosis not present

## 2018-08-22 DIAGNOSIS — R05 Cough: Secondary | ICD-10-CM | POA: Diagnosis not present

## 2018-08-22 DIAGNOSIS — I10 Essential (primary) hypertension: Secondary | ICD-10-CM | POA: Insufficient documentation

## 2018-08-22 DIAGNOSIS — R0981 Nasal congestion: Secondary | ICD-10-CM | POA: Diagnosis not present

## 2018-08-22 DIAGNOSIS — R079 Chest pain, unspecified: Secondary | ICD-10-CM

## 2018-08-22 LAB — BASIC METABOLIC PANEL
Anion gap: 6 (ref 5–15)
BUN: 16 mg/dL (ref 6–20)
CO2: 27 mmol/L (ref 22–32)
Calcium: 8.9 mg/dL (ref 8.9–10.3)
Chloride: 103 mmol/L (ref 98–111)
Creatinine, Ser: 1.24 mg/dL (ref 0.61–1.24)
GFR calc Af Amer: >60 mL/min (ref >60)
GFR calc non Af Amer: >60 mL/min (ref >60)
Glucose, Bld: 105 mg/dL — ABNORMAL HIGH (ref 70–99)
Potassium: 4 mmol/L (ref 3.5–5.1)
Sodium: 136 mmol/L (ref 135–145)

## 2018-08-22 LAB — CBC
HCT: 44.6 % (ref 39.0–52.0)
Hemoglobin: 14.8 g/dL (ref 13.0–17.0)
MCH: 29.4 pg (ref 26.0–34.0)
MCHC: 33.2 g/dL (ref 30.0–36.0)
MCV: 88.7 fL (ref 80.0–100.0)
Platelets: 216 10*3/uL (ref 150–400)
RBC: 5.03 MIL/uL (ref 4.22–5.81)
RDW: 12.9 % (ref 11.5–15.5)
WBC: 9.6 10*3/uL (ref 4.0–10.5)
nRBC: 0 % (ref 0.0–0.2)

## 2018-08-22 LAB — TROPONIN I: Troponin I: <0.03 ng/mL (ref <0.03)

## 2018-08-22 MED ORDER — SODIUM CHLORIDE 0.9% FLUSH: 3.0000 mL | Freq: Once | INTRAVENOUS | Status: DC

## 2018-08-22 MED ORDER — OXYMETAZOLINE HCL 0.05 % NA SOLN
1.0000 | Freq: Once | NASAL | Status: AC
  Administered 2018-08-22: 10:00:00 1 via NASAL
  Filled 2018-08-22: qty 30

## 2018-08-22 MED ORDER — HYDROCHLOROTHIAZIDE 25 MG PO TABS
25.0000 mg | ORAL_TABLET | Freq: Every day | ORAL | Status: DC
  Administered 2018-08-22: 10:00:00 25 mg via ORAL
  Filled 2018-08-22: qty 1

## 2018-08-22 MED ORDER — SODIUM CHLORIDE 0.9 % IV BOLUS
1000.0000 mL | Freq: Once | INTRAVENOUS | Status: AC
  Administered 2018-08-22: 10:00:00 1000 mL via INTRAVENOUS

## 2018-08-22 MED ORDER — HYDROCHLOROTHIAZIDE 25 MG PO TABS: 25.0000 mg | ORAL_TABLET | Freq: Every day | ORAL | 1 refills | Status: DC

## 2018-08-22 NOTE — ED Triage Notes (Signed)
States feels "swimmy headed" since yesterday. States has had sinus congestion and drainage. Was concerned blood pressure might be elevated. Denies headache. Smile symmetrical, grips equal.

## 2018-08-22 NOTE — ED Provider Notes (Signed)
[facility] Emergency Department Provider Note  ____________________________________________  Time seen: Approximately 10:21 AM  I have reviewed the triage vital signs and the nursing notes.   HISTORY  Chief Complaint Dizziness and Chest Pain   HPI Jerry Anderson is a 38 y.o. male with a history of hypertension who presents for evaluation of dizziness.  Patient reports that he has been off of his antihypertensive medications for several months.  He has had a dry cough, congestion, stuffy nose for the last several days.  This morning woke up feeling swimmy headed.  Patient reports several prior episodes of dizziness in the setting of sinus congestion.  He reports that he works for an [employer] and is outside a lot and the pollen has been very heavy which usually gives him nasal congestion.  He denies headache, slurred speech, facial droop, unilateral weakness or numbness.  He is a smoker.  No personal family history of stroke.  He denies fever or shortness of breath, known exposure or travel to endemic areas of COVID 19. He is also complaining of several episodes of chest pain which she describes as a sharp pain located in the left side of his chest that lasts a few seconds and resolves without intervention.  The pain has happened several times today both at rest and with ambulation.  No pain at this time.  No personal family history of heart attacks, PE or DVT, no hemoptysis, no recent travel immobilization, no leg pain or swelling, no exogenous hormones.  Past Medical History:  Diagnosis Date  . Hypertension     There are no active problems to display for this patient.   History reviewed. No pertinent surgical history.  Prior to Admission medications   Medication Sig Start Date End Date Taking? Authorizing Provider  hydrochlorothiazide (HYDRODIURIL) 25 MG tablet Take 1 tablet (25 mg total) by mouth daily. 08/22/18   Nita Sickle, MD  ondansetron  (ZOFRAN ODT) 4 MG disintegrating tablet Take 1 tablet (4 mg total) by mouth every 8 (eight) hours as needed for nausea or vomiting. 12/23/16   Emily Filbert, MD  predniSONE (DELTASONE) 20 MG tablet Take 2 tablets (40 mg total) by mouth daily. Patient not taking: Reported on 12/23/2016 04/23/16   Minna Antis, MD  prochlorperazine (COMPAZINE) 10 MG tablet Take 1 tablet (10 mg total) by mouth every 6 (six) hours as needed (headache). 04/19/17   Phineas Semen, MD    Allergies Patient has no known allergies.  No family history on file.  Social History Social History   Tobacco Use  . Smoking status: Current Every Day Smoker    Packs/day: 0.50    Types: Cigarettes  . Smokeless tobacco: Never Used  Substance Use Topics  . Alcohol use: Yes  . Drug use: No    Review of Systems  Constitutional: Negative for fever. + dizziness Eyes: Negative for visual changes. ENT: Negative for sore throat. + congestion Neck: No neck pain  Cardiovascular: + chest pain. Respiratory: Negative for shortness of breath. + cough Gastrointestinal: Negative for abdominal pain, vomiting or diarrhea. Genitourinary: Negative for dysuria. Musculoskeletal: Negative for back pain. Skin: Negative for rash. Neurological: Negative for headaches, weakness or numbness. Psych: No SI or HI  ____________________________________________   PHYSICAL EXAM:  VITAL SIGNS: ED Triage Vitals  Enc Vitals Group     BP 08/22/18 0951 (!) 144/91     Pulse Rate 08/22/18 0951 81     Resp 08/22/18 0951 20  Temp 08/22/18 0951 98.2 F (36.8 C)     Temp Source 08/22/18 0951 Oral     SpO2 08/22/18 0951 100 %     Weight 08/22/18 0952 235 lb (106.6 kg)     Height 08/22/18 0952  (1.702 m)     Head Circumference --      Peak Flow --      Pain Score 08/22/18 0952 6     Pain Loc --      Pain Edu? --      Excl. in GC? --     Constitutional: Alert and oriented. Well appearing and in no apparent distress. HEENT:       Head: Normocephalic and atraumatic.         Eyes: Conjunctivae are normal. Sclera is non-icteric.       Mouth/Throat: Mucous membranes are moist.       Ears: TMs visualized and clear      Neck: Supple with no signs of meningismus. Cardiovascular: Regular rate and rhythm. No murmurs, gallops, or rubs. 2+ symmetrical distal pulses are present in all extremities. No JVD. Respiratory: Normal respiratory effort. Lungs are clear to auscultation bilaterally. No wheezes, crackles, or rhonchi.  Gastrointestinal: Soft, non tender, and non distended with positive bowel sounds. No rebound or guarding. Musculoskeletal: Nontender with normal range of motion in all extremities. No edema, cyanosis, or erythema of extremities. Neurologic: Normal speech and language. Face is symmetric.  Intact strength and sensation x4, no dysmetria, no pronator drift, normal gait. Skin: Skin is warm, dry and intact. No rash noted. Psychiatric: Mood and affect are normal. Speech and behavior are normal.  ____________________________________________   LABS (all labs ordered are listed, but only abnormal results are displayed)  Labs Reviewed  BASIC METABOLIC PANEL - Abnormal; Notable for the following components:      Result Value   Glucose, Bld 105 (*)    All other components within normal limits  CBC  TROPONIN I   ____________________________________________  EKG  ED ECG REPORT I, Nita Sickle, the attending physician, personally viewed and interpreted this ECG.  Normal sinus rhythm, rate of 72, normal intervals, normal axis, LVH, T wave inversions in inferior lateral leads with no ST elevations.  Unchanged from prior. ____________________________________________  RADIOLOGY  I have personally reviewed the images performed during this visit and I agree with the Radiologist's read.   Interpretation by Radiologist:  Dg Chest Port 1 View  Result Date: 08/22/2018 CLINICAL DATA:  States feels "swimmy  headed" since yesterday. States has had sinus congestion and drainage. Was concerned blood pressure might be elevated. Denies headache. Chest pain began this morning. Hx - HTN, current smoker 0.5 ppd. EXAM: PORTABLE CHEST 1 VIEW COMPARISON:  12/23/2016 FINDINGS: Normal heart, mediastinum and hila. Clear lungs.  No pleural effusion or pneumothorax. Skeletal structures unremarkable. IMPRESSION: No active disease. Electronically Signed   By: Amie Portland M.D.   On: 08/22/2018 10:59      ____________________________________________   PROCEDURES  Procedure(s) performed: None Procedures Critical Care performed:  None ____________________________________________   INITIAL IMPRESSION / ASSESSMENT AND PLAN / ED COURSE   38 y.o. male with a history of hypertension who presents for evaluation of dizziness.  Patient is extremely well-appearing in no distress, slightly hypertensive, neurologically intact.  # dizziness: Most likely from sinus congestion due to environmental exposure versus viral illness.  Normal work of breathing, normal sats, neurologically intact.  Low suspicion for anything more concerning like a stroke at this time.  Will give Afrin to help decongest.  # CP: Atypical short-lived episodes of sharp chest pain.  No pain at this time.  EKG is unchanged from baseline.  Will get a troponin.  Low suspicion for ACS.  Chest x-ray with no evidence of pneumonia, pulmonary edema, pneumothorax.  # HTN: We will restart patient on his antihypertensive medication.  Clinical Course as of Aug 21 1145  Sat Aug 22, 2018  1145 Dizziness resolved after IV fluids and nasal decongestant.  Troponin is negative and done more than 3 hours from last episode of atypical chest pain therefore no need for repeat troponin.  Blood pressure is trending down with hydrochlorothiazide.  Will discharge home with a prescription for it and follow-up with primary care doctor.  Discussed my standard return precautions    [CV]    Clinical Course User Index [CV] Don Perking [place], MD     As part of my medical decision making, I reviewed the following data within the electronic MEDICAL RECORD NUMBER Nursing notes reviewed and incorporated, Labs reviewed , EKG interpreted , Old EKG reviewed, Old chart reviewed, Radiograph reviewed , Notes from prior ED visits and [place] Controlled Substance Database    Pertinent labs & imaging results that were available during my care of the patient were reviewed by me and considered in my medical decision making (see chart for details).    ____________________________________________   FINAL CLINICAL IMPRESSION(S) / ED DIAGNOSES  Final diagnoses:  Dizziness  Congestion of nasal sinus  Atypical chest pain      NEW MEDICATIONS STARTED DURING THIS VISIT:  ED Discharge Orders         Ordered    hydrochlorothiazide (HYDRODIURIL) 25 MG tablet  Daily     08/22/18 1147           Note:  This document was prepared using Dragon voice recognition software and may include unintentional dictation errors.    Don Perking, [place], MD 08/22/18 [phone]

## 2018-08-22 NOTE — ED Triage Notes (Signed)
During triage also states has chest pain since this am, EKG and to room 13.

## 2018-08-22 NOTE — ED Notes (Signed)
ED Provider at bedside. 

## 2018-09-14 ENCOUNTER — Emergency Department: Payer: BLUE CROSS/BLUE SHIELD

## 2018-09-14 ENCOUNTER — Other Ambulatory Visit: Payer: Self-pay

## 2018-09-14 ENCOUNTER — Emergency Department
Admission: EM | Admit: 2018-09-14 | Discharge: 2018-09-14 | Disposition: A | Discharge status: Home / Self Care | Payer: BLUE CROSS/BLUE SHIELD | Attending: Emergency Medicine | Admitting: Emergency Medicine

## 2018-09-14 ENCOUNTER — Encounter: Payer: Self-pay | Admitting: Emergency Medicine

## 2018-09-14 DIAGNOSIS — I10 Essential (primary) hypertension: Secondary | ICD-10-CM | POA: Diagnosis not present

## 2018-09-14 DIAGNOSIS — F17210 Nicotine dependence, cigarettes, uncomplicated: Secondary | ICD-10-CM | POA: Insufficient documentation

## 2018-09-14 DIAGNOSIS — Z79899 Other long term (current) drug therapy: Secondary | ICD-10-CM | POA: Insufficient documentation

## 2018-09-14 DIAGNOSIS — H81399 Other peripheral vertigo, unspecified ear: Secondary | ICD-10-CM | POA: Diagnosis not present

## 2018-09-14 DIAGNOSIS — R0789 Other chest pain: Secondary | ICD-10-CM

## 2018-09-14 DIAGNOSIS — R079 Chest pain, unspecified: Secondary | ICD-10-CM | POA: Insufficient documentation

## 2018-09-14 LAB — COMPREHENSIVE METABOLIC PANEL
ALT: 41 U/L (ref 0–44)
AST: 26 U/L (ref 15–41)
Albumin: 4.5 g/dL (ref 3.5–5.0)
Alkaline Phosphatase: 63 U/L (ref 38–126)
Anion gap: 8 (ref 5–15)
BUN: 21 mg/dL — ABNORMAL HIGH (ref 6–20)
CO2: 26 mmol/L (ref 22–32)
Calcium: 9.3 mg/dL (ref 8.9–10.3)
Chloride: 100 mmol/L (ref 98–111)
Creatinine, Ser: 0.99 mg/dL (ref 0.61–1.24)
GFR calc Af Amer: >60 mL/min (ref >60)
GFR calc non Af Amer: >60 mL/min (ref >60)
Glucose, Bld: 128 mg/dL — ABNORMAL HIGH (ref 70–99)
Potassium: 3.7 mmol/L (ref 3.5–5.1)
Sodium: 134 mmol/L — ABNORMAL LOW (ref 135–145)
Total Bilirubin: 0.8 mg/dL (ref 0.3–1.2)
Total Protein: 7.7 g/dL (ref 6.5–8.1)

## 2018-09-14 LAB — CBC
HCT: 44.5 % (ref 39.0–52.0)
Hemoglobin: 15.2 g/dL (ref 13.0–17.0)
MCH: 29.2 pg (ref 26.0–34.0)
MCHC: 34.2 g/dL (ref 30.0–36.0)
MCV: 85.6 fL (ref 80.0–100.0)
Platelets: 252 10*3/uL (ref 150–400)
RBC: 5.2 MIL/uL (ref 4.22–5.81)
RDW: 12 % (ref 11.5–15.5)
WBC: 9.8 10*3/uL (ref 4.0–10.5)
nRBC: 0 % (ref 0.0–0.2)

## 2018-09-14 LAB — TROPONIN I
Troponin I: <0.03 ng/mL (ref <0.03)
Troponin I: <0.03 ng/mL (ref <0.03)

## 2018-09-14 MED ORDER — SODIUM CHLORIDE 0.9% FLUSH: 3.0000 mL | Freq: Once | INTRAVENOUS | Status: DC

## 2018-09-14 MED ORDER — MECLIZINE HCL 25 MG PO TABS
25.0000 mg | ORAL_TABLET | Freq: Once | ORAL | Status: AC
  Administered 2018-09-14: 25 mg via ORAL
  Filled 2018-09-14: qty 1

## 2018-09-14 MED ORDER — SODIUM CHLORIDE 0.9 % IV BOLUS
1000.0000 mL | Freq: Once | INTRAVENOUS | Status: AC
  Administered 2018-09-14: 1000 mL via INTRAVENOUS

## 2018-09-14 MED ORDER — MECLIZINE HCL 25 MG PO TABS: 25.0000 mg | ORAL_TABLET | Freq: Three times a day (TID) | ORAL | 0 refills | Status: AC | PRN

## 2018-09-14 NOTE — ED Triage Notes (Signed)
States had sharp chest pain accompanied by nausea and vomiting lasting couple minutes. States has decreased but still present.

## 2018-09-14 NOTE — ED Provider Notes (Signed)
[facility] Emergency Department Provider Note ____________________________________________   First MD Initiated Contact with Patient 09/14/18 1135     (approximate)  I have reviewed the triage vital signs and the nursing notes.   HISTORY  Chief Complaint Chest Pain    HPI Jerry Anderson is a 38 y.o. male with PMH as noted below who presents with chest pain, acute onset few hours ago, described as sharp, and occurring while he was at work although he states he was not exerting himself.  He states that he began to feel "swimmy headed" around the same time which he describes both as lightheaded but also as a spinning or movement sensation.  He states that he felt nauseous and sweaty.  He reports that the symptoms have now improved but not completely resolved.  He had a similar episode earlier this month although he is not really sure if it was the same.  He thinks that today's chest pain was worse.  Past Medical History:  Diagnosis Date  . Hypertension     There are no active problems to display for this patient.   History reviewed. No pertinent surgical history.  Prior to Admission medications   Medication Sig Start Date End Date Taking? Authorizing Provider  atorvastatin (LIPITOR) 10 MG tablet Take 10 mg by mouth daily. 08/26/18 08/26/19 Yes [provider]  lisinopril-hydrochlorothiazide (ZESTORETIC) 20-25 MG tablet Take 1 tablet by mouth daily. 08/25/18 08/25/19 Yes [provider]  meclizine (ANTIVERT) 25 MG tablet Take 1 tablet (25 mg total) by mouth 3 (three) times daily as needed for dizziness. 09/14/18   Dionne BucySiadecki, [place] Blizzard, MD    Allergies Patient has no known allergies.  No family history on file.  Social History Social History   Tobacco Use  . Smoking status: Current Every Day Smoker    Packs/day: 0.50    Types: Cigarettes  . Smokeless tobacco: Never Used  Substance Use Topics  . Alcohol use: Yes  . Drug use: No     Review of Systems  Constitutional: No fever/chills. Eyes: No visual changes. ENT: No sore throat. Cardiovascular: Positive for resolving chest pain. Respiratory: Denies shortness of breath or cough. Gastrointestinal: Positive for 1 episode of vomiting. Genitourinary: Negative for flank pain.  Musculoskeletal: Negative for back pain. Skin: Negative for rash. Neurological: Negative for headache.   ____________________________________________   PHYSICAL EXAM:  VITAL SIGNS: ED Triage Vitals  Enc Vitals Group     BP 09/14/18 1129 (!) 149/83     Pulse Rate 09/14/18 1129 73     Resp 09/14/18 1129 18     Temp 09/14/18 1129 98.4 F (36.9 C)     Temp Source 09/14/18 1129 Oral     SpO2 09/14/18 1129 100 %     Weight 09/14/18 1123 231 lb (104.8 kg)     Height 09/14/18 1123 5\' 11"  (1.803 m)     Head Circumference --      Peak Flow --      Pain Score 09/14/18 1123 7     Pain Loc --      Pain Edu? --      Excl. in GC? --     Constitutional: Alert and oriented. Well appearing and in no acute distress. Eyes: Conjunctivae are normal.  Head: Atraumatic. Nose: No congestion/rhinnorhea. Mouth/Throat: Mucous membranes are moist.   Neck: Normal range of motion.  Cardiovascular: Normal rate, regular rhythm. Grossly normal heart sounds.  Good peripheral circulation. Respiratory: Normal respiratory effort.  No retractions. Lungs CTAB. Gastrointestinal: No distention.  Musculoskeletal: No lower extremity edema.  Extremities warm and well perfused.  Neurologic:  Normal speech and language.  Motor intact in all extremities.  Normal coordination with no ataxia.  No gross focal neurologic deficits are appreciated.  Skin:  Skin is warm and dry. No rash noted. Psychiatric: Mood and affect are normal. Speech and behavior are normal.  ____________________________________________   LABS (all labs ordered are listed, but only abnormal results are displayed)  Labs Reviewed  COMPREHENSIVE  METABOLIC PANEL - Abnormal; Notable for the following components:      Result Value   Sodium 134 (*)    Glucose, Bld 128 (*)    BUN 21 (*)    All other components within normal limits  CBC  TROPONIN I  TROPONIN I   ____________________________________________  EKG  ED ECG REPORT I, Dionne Bucy, the attending physician, personally viewed and interpreted this ECG.  Date: 09/14/2018 EKG Time: 1125 Rate: 79 Rhythm: normal sinus rhythm QRS Axis: normal Intervals: normal ST/T Wave abnormalities: Nonspecific inferior and lateral T wave inversions Narrative Interpretation: Nonspecific findings with no evidence of acute ischemia; no significant change when compared to EKG of 08/22/2018  ____________________________________________  RADIOLOGY  CXR: No focal infiltrate or other acute abnormality  ____________________________________________   PROCEDURES  Procedure(s) performed: No  Procedures  Critical Care performed: No ____________________________________________   INITIAL IMPRESSION / ASSESSMENT AND PLAN / ED COURSE  Pertinent labs & imaging results that were available during my care of the patient were reviewed by me and considered in my medical decision making (see chart for details).  38 year old male with a history of hypertension who states he is compliant with medications presents with atypical chest pain and near syncope while at work today.  He also reports a component of vertigo.  I reviewed the past medical records in Epic.  The patient was seen in the ED on 08/22/2018 for what sounds like a similar episode although he thinks that today's episode may have been worse.  He had negative cardiac enzymes at that time.  On exam, he is well-appearing.  Vital signs are normal except for hypertension.  Neuro exam is nonfocal.  The remainder of the exam is unremarkable.  EKG shows nonspecific T wave abnormalities but no changes when compared to prior EKGs.  Overall  the presentation is most consistent with vasovagal near syncope, most likely precipitated by peripheral vertigo given the sudden onset.  The patient states that although his symptoms have almost completely resolved, he still feels spinning when he turns his head or changes positions.  Given that the symptoms have mostly resolved, I do not suspect pulmonary embolism, or aortic dissection or other vascular etiology.  I have a low suspicion for ACS given the atypical nature of the pain and the previous similar episode with negative work-up.  We will give fluids and meclizine for symptomatic treatment, obtain basic labs and cardiac enzymes and reassess.  ----------------------------------------- 3:17 PM on 09/14/2018 -----------------------------------------  The patient is feeling much better.  His vertigo has resolved.  He has no active chest pain.  Troponins have been negative x2.  I counseled him on the results of the work-up.  He is stable for discharge at this time.  Return precautions given, and he expresses understanding.  He agrees to follow-up with his regular doctor. ____________________________________________   FINAL CLINICAL IMPRESSION(S) / ED DIAGNOSES  Final diagnoses:  Atypical chest pain  Peripheral vertigo, unspecified laterality  NEW MEDICATIONS STARTED DURING THIS VISIT:  New Prescriptions   MECLIZINE (ANTIVERT) 25 MG TABLET    Take 1 tablet (25 mg total) by mouth 3 (three) times daily as needed for dizziness.     Note:  This document was prepared using Dragon voice recognition software and may include unintentional dictation errors.    Dionne Bucy, MD 09/14/18 1517

## 2018-09-14 NOTE — ED Notes (Signed)
Pt verbalized understanding of d/c instructions, RX, and f/u care. No further questions at this time. Pt ambulatory to the exit with steady gait  

## 2018-09-14 NOTE — Discharge Instructions (Signed)
You can take the meclizine as needed for dizziness or spinning.  Make an appointment to follow-up with your primary care doctor within the next few weeks.  Return to the ER for new, worsening, or persistent severe chest pain, dizziness or spinning, lightheadedness, vomiting, fever, or any other new or worsening symptoms that concern you.

## 2019-02-26 ENCOUNTER — Other Ambulatory Visit: Payer: Self-pay

## 2019-02-26 ENCOUNTER — Encounter: Payer: Self-pay | Admitting: Emergency Medicine

## 2019-02-26 ENCOUNTER — Emergency Department
Admission: EM | Admit: 2019-02-26 | Discharge: 2019-02-27 | Disposition: A | Discharge status: Home / Self Care | Payer: BC Managed Care – PPO | Attending: Emergency Medicine | Admitting: Emergency Medicine

## 2019-02-26 DIAGNOSIS — Z20828 Contact with and (suspected) exposure to other viral communicable diseases: Secondary | ICD-10-CM | POA: Diagnosis not present

## 2019-02-26 DIAGNOSIS — Z79899 Other long term (current) drug therapy: Secondary | ICD-10-CM | POA: Diagnosis not present

## 2019-02-26 DIAGNOSIS — R0981 Nasal congestion: Secondary | ICD-10-CM | POA: Diagnosis present

## 2019-02-26 DIAGNOSIS — R1110 Vomiting, unspecified: Secondary | ICD-10-CM | POA: Insufficient documentation

## 2019-02-26 DIAGNOSIS — F17210 Nicotine dependence, cigarettes, uncomplicated: Secondary | ICD-10-CM | POA: Diagnosis not present

## 2019-02-26 DIAGNOSIS — J40 Bronchitis, not specified as acute or chronic: Secondary | ICD-10-CM | POA: Insufficient documentation

## 2019-02-26 DIAGNOSIS — I10 Essential (primary) hypertension: Secondary | ICD-10-CM | POA: Insufficient documentation

## 2019-02-26 HISTORY — DX: Hyperlipidemia, unspecified: E78.5

## 2019-02-26 LAB — COMPREHENSIVE METABOLIC PANEL
ALT: 33 U/L (ref 0–44)
AST: 21 U/L (ref 15–41)
Albumin: 4.4 g/dL (ref 3.5–5.0)
Alkaline Phosphatase: 77 U/L (ref 38–126)
Anion gap: 11 (ref 5–15)
BUN: 13 mg/dL (ref 6–20)
CO2: 25 mmol/L (ref 22–32)
Calcium: 9.5 mg/dL (ref 8.9–10.3)
Chloride: 105 mmol/L (ref 98–111)
Creatinine, Ser: 1.04 mg/dL (ref 0.61–1.24)
GFR calc Af Amer: >60 mL/min (ref >60)
GFR calc non Af Amer: >60 mL/min (ref >60)
Glucose, Bld: 123 mg/dL — ABNORMAL HIGH (ref 70–99)
Potassium: 3.3 mmol/L — ABNORMAL LOW (ref 3.5–5.1)
Sodium: 141 mmol/L (ref 135–145)
Total Bilirubin: 0.5 mg/dL (ref 0.3–1.2)
Total Protein: 7.4 g/dL (ref 6.5–8.1)

## 2019-02-26 LAB — CBC
HCT: 43.3 % (ref 39.0–52.0)
Hemoglobin: 14.4 g/dL (ref 13.0–17.0)
MCH: 29 pg (ref 26.0–34.0)
MCHC: 33.3 g/dL (ref 30.0–36.0)
MCV: 87.3 fL (ref 80.0–100.0)
Platelets: 217 10*3/uL (ref 150–400)
RBC: 4.96 MIL/uL (ref 4.22–5.81)
RDW: 13 % (ref 11.5–15.5)
WBC: 12.9 10*3/uL — ABNORMAL HIGH (ref 4.0–10.5)
nRBC: 0 % (ref 0.0–0.2)

## 2019-02-26 LAB — URINALYSIS, COMPLETE (UACMP) WITH MICROSCOPIC
Bacteria, UA: NONE SEEN
Bilirubin Urine: NEGATIVE
Glucose, UA: NEGATIVE mg/dL
Hgb urine dipstick: NEGATIVE
Ketones, ur: NEGATIVE mg/dL
Leukocytes,Ua: NEGATIVE
Nitrite: NEGATIVE
Protein, ur: NEGATIVE mg/dL
Specific Gravity, Urine: 1.028 (ref 1.005–1.030)
Squamous Epithelial / HPF: NONE SEEN (ref 0–5)
pH: 5 (ref 5.0–8.0)

## 2019-02-26 LAB — LIPASE, BLOOD: Lipase: 28 U/L (ref 11–51)

## 2019-02-26 MED ORDER — DOXYCYCLINE HYCLATE 100 MG PO CAPS: 100.0000 mg | ORAL_CAPSULE | Freq: Two times a day (BID) | ORAL | 0 refills | Status: AC

## 2019-02-26 MED ORDER — HYDROCODONE-HOMATROPINE 5-1.5 MG/5ML PO SYRP: 5.0000 mL | ORAL_SOLUTION | Freq: Four times a day (QID) | ORAL | 0 refills | Status: AC | PRN

## 2019-02-26 MED ORDER — ONDANSETRON 4 MG PO TBDP
4.0000 mg | ORAL_TABLET | Freq: Once | ORAL | Status: AC
  Administered 2019-02-26: 4 mg via ORAL
  Filled 2019-02-26: qty 1

## 2019-02-26 MED ORDER — DOXYCYCLINE HYCLATE 100 MG PO TABS
100.0000 mg | ORAL_TABLET | Freq: Once | ORAL | Status: AC
  Administered 2019-02-26: 100 mg via ORAL
  Filled 2019-02-26: qty 1

## 2019-02-26 MED ORDER — OXYMETAZOLINE HCL 0.05 % NA SOLN
2.0000 | Freq: Once | NASAL | Status: AC
  Administered 2019-02-26: 2 via NASAL
  Filled 2019-02-26: qty 30

## 2019-02-26 MED ORDER — OXYMETAZOLINE HCL 0.05 % NA SOLN: 1.0000 | Freq: Once | NASAL | Status: DC

## 2019-02-26 MED ORDER — DEXAMETHASONE 4 MG PO TABS
10.0000 mg | ORAL_TABLET | Freq: Once | ORAL | Status: AC
  Administered 2019-02-26: 10 mg via ORAL
  Filled 2019-02-26: qty 2.5

## 2019-02-26 MED ORDER — ONDANSETRON 4 MG PO TBDP: 4.0000 mg | ORAL_TABLET | Freq: Three times a day (TID) | ORAL | 0 refills | Status: AC | PRN

## 2019-02-26 MED ORDER — CLONIDINE HCL 0.1 MG PO TABS
0.1000 mg | ORAL_TABLET | Freq: Once | ORAL | Status: AC
  Administered 2019-02-27: 0.1 mg via ORAL
  Filled 2019-02-26: qty 1

## 2019-02-26 NOTE — ED Notes (Signed)
Report given to Kate, RN

## 2019-02-26 NOTE — Discharge Instructions (Addendum)
You can use the oxymetazoline/Afrin for up to 3 days.  Use 1 spray in both nostrils twice a day as needed for nasal congestion.  Do not use this for more than 3 days.  Your blood pressure was very elevated today.  It is very important that you do not take any over-the-counter decongestants, such as Sudafed, as this can cause your blood pressure to go up.  Take the prescribed medication for your cough as well as antibiotic.

## 2019-02-26 NOTE — ED Triage Notes (Signed)
Pt arrives POV to triage with c/o congestion x 1 week. Pt states that tonight he was throwing up when he started coughing. Pt is in NAD.

## 2019-02-26 NOTE — ED Provider Notes (Signed)
[facility] Emergency Department Provider Note  ____________________________________________   First MD Initiated Contact with Patient 02/26/19 2154     (approximate)  I have reviewed the triage vital signs and the nursing notes.   HISTORY  Chief Complaint Nasal Congestion and Emesis    HPI Jerry Anderson is a 38 y.o. male with history of hypertension, hyperlipidemia, here with nasal congestion, cough.  The patient states that over the last several days, he has had sinus congestion, pressure, nasal congestion.  He states he has had a mild, initially dry cough that is now become productive.  He states he said coughing spells that it caused him to vomit.  He states he has had some mild shortness of breath.  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  No known sick contacts but he is around multiple people at work.  Denies any overt abdominal pain.  No known fevers but has had some chills.  No other complaints.  He has been trying over-the-counter medications without significant relief.  No chest pain.        Past Medical History:  Diagnosis Date   Hyperlipidemia    Hypertension     There are no active problems to display for this patient.   History reviewed. No pertinent surgical history.  Prior to Admission medications   Medication Sig Start Date End Date Taking? Authorizing Provider  atorvastatin (LIPITOR) 10 MG tablet Take 10 mg by mouth daily. 08/26/18 08/26/19  [provider]  doxycycline (VIBRAMYCIN) 100 MG capsule Take 1 capsule (100 mg total) by mouth 2 (two) times daily for 7 days. 02/26/19 03/05/19  Shaune Pollack, MD  HYDROcodone-homatropine [facility]) 5-1.5 MG/5ML syrup Take 5 mLs by mouth every 6 (six) hours as needed for cough. 02/26/19   Shaune Pollack, MD  lisinopril-hydrochlorothiazide (ZESTORETIC) 20-25 MG tablet Take 1 tablet by mouth daily. 08/25/18 08/25/19  [provider]  meclizine  (ANTIVERT) 25 MG tablet Take 1 tablet (25 mg total) by mouth 3 (three) times daily as needed for dizziness. 09/14/18   Dionne Bucy, MD  ondansetron (ZOFRAN ODT) 4 MG disintegrating tablet Take 1 tablet (4 mg total) by mouth every 8 (eight) hours as needed for nausea or vomiting. 02/26/19   Shaune Pollack, MD    Allergies Patient has no known allergies.  No family history on file.  Social History Social History   Tobacco Use   Smoking status: Current Every Day Smoker    Packs/day: 0.50    Types: Cigarettes   Smokeless tobacco: Never Used  Substance Use Topics   Alcohol use: Yes   Drug use: No    Review of Systems  Review of Systems  Constitutional: Positive for chills and fatigue. Negative for fever.  HENT: Positive for congestion, rhinorrhea and sore throat.   Respiratory: Positive for cough. Negative for shortness of breath.   Cardiovascular: Negative for chest pain.  Gastrointestinal: Negative for abdominal pain.  Genitourinary: Negative for flank pain.  Musculoskeletal: Negative for neck pain.  Skin: Negative for rash and wound.  Allergic/Immunologic: Negative for immunocompromised state.  Neurological: Negative for weakness and numbness.  Hematological: Does not bruise/bleed easily.  All other systems reviewed and are negative.    ____________________________________________  PHYSICAL EXAM:      VITAL SIGNS: ED Triage Vitals  Enc Vitals Group     BP 02/26/19 2030 (!) 177/97     Pulse Rate 02/26/19 2030 97  Resp 02/26/19 2030 18     Temp 02/26/19 2030 99.2 F (37.3 C)     Temp Source 02/26/19 2030 Oral     SpO2 02/26/19 2030 99 %     Weight 02/26/19 2029 225 lb (102.1 kg)     Height 02/26/19 2029 5\' 7"  (1.702 m)     Head Circumference --      Peak Flow --      Pain Score 02/26/19 2029 6     Pain Loc --      Pain Edu? --      Excl. in [place]? --      Physical Exam Vitals signs and nursing note reviewed.  Constitutional:      General: He is  not in acute distress.    Appearance: He is well-developed.  HENT:     Head: Normocephalic and atraumatic.     Comments: Moderate nasal congestion bilaterally.  Sinus tenderness to maxillary sinus bilaterally.  No facial erythema or swelling. Eyes:     Conjunctiva/sclera: Conjunctivae normal.  Neck:     Musculoskeletal: Neck supple.  Cardiovascular:     Rate and Rhythm: Normal rate and regular rhythm.     Heart sounds: Normal heart sounds. No murmur. No friction rub.  Pulmonary:     Effort: Pulmonary effort is normal. No respiratory distress.     Breath sounds: Normal breath sounds. No wheezing or rales.     Comments: Normal work of breathing, occasional coarse cough Abdominal:     General: There is no distension.     Palpations: Abdomen is soft.     Tenderness: There is no abdominal tenderness.  Skin:    General: Skin is warm.     Capillary Refill: Capillary refill takes less than 2 seconds.  Neurological:     Mental Status: He is alert and oriented to person, place, and time.     Motor: No abnormal muscle tone.       ____________________________________________   LABS (all labs ordered are listed, but only abnormal results are displayed)  Labs Reviewed  COMPREHENSIVE METABOLIC PANEL - Abnormal; Notable for the following components:      Result Value   Potassium 3.3 (*)    Glucose, Bld 123 (*)    All other components within normal limits  CBC - Abnormal; Notable for the following components:   WBC 12.9 (*)    All other components within normal limits  URINALYSIS, COMPLETE (UACMP) WITH MICROSCOPIC - Abnormal; Notable for the following components:   Color, Urine YELLOW (*)    APPearance CLEAR (*)    All other components within normal limits  NOVEL CORONAVIRUS, NAA (HOSP ORDER, SEND-OUT TO REF LAB; TAT 18-24 HRS)  LIPASE, BLOOD    ____________________________________________ ________________________________________  RADIOLOGY All imaging, including plain films,  CT scans, and ultrasounds, independently reviewed by me, and interpretations confirmed via formal radiology reads.  ED MD interpretation:   Chest x-ray: Clear  Official radiology report(s): Dg Chest Portable 1 View  Result Date: 02/27/2019 CLINICAL DATA:  Cough and shortness of breath EXAM: PORTABLE CHEST 1 VIEW COMPARISON:  None. FINDINGS: The heart size and mediastinal contours are within normal limits. Both lungs are clear. The visualized skeletal structures are unremarkable. IMPRESSION: No active disease. Electronically Signed   By: Ulyses Jarred M.D.   On: 02/27/2019 00:41    ____________________________________________  PROCEDURES   Procedure(s) performed (including Critical Care):  Procedures  ____________________________________________  INITIAL IMPRESSION / MDM / ASSESSMENT AND PLAN /  ED COURSE  As part of my medical decision making, I reviewed the following data within the electronic MEDICAL RECORD NUMBER       *Dorothy PufferLorenzo S Amadon was evaluated in Emergency Department on 02/27/2019 for the symptoms described in the history of present illness. He was evaluated in the context of the global COVID-19 pandemic, which necessitated consideration that the patient might be at risk for infection with the SARS-CoV-2 virus that causes COVID-19. Institutional protocols and algorithms that pertain to the evaluation of patients at risk for COVID-19 are in a state of rapid change based on information released by regulatory bodies including the CDC and federal and state organizations. These policies and algorithms were followed during the patient's care in the ED.  Some ED evaluations and interventions may be delayed as a result of limited staffing during the pandemic.*   Clinical Course as of Feb 26 130  Sat Feb 27, 2019  [id number] 38 year old male here with nasal congestion, cough, post tussive emesis.  Suspect viral URI with possible sinusitis and early pneumonia.  He is satting well on room air.   High concern for possible coronavirus as well.  He has a history of smoking.  Will give a dose of Decadron here.  Otherwise, he has no evidence of sepsis.  Systemically well-appearing.  Will give empiric doxycycline, which should cover for both sinusitis as well as possible early pneumonia.  He was noted to be significantly hypertensive here and has a history of same.  Been taking over-the-counter medications.  Will have him stop these, as they could be contributing.  He has no chest pain or evidence to suggest hypertensive emergency.  Will treat empirically for possible early bacterial sinusitis/bronchitis, and outpatient COVID has been sent.   [CI]    Clinical Course User Index [CI] Shaune PollackIsaacs, Katty Fretwell, MD    Medical Decision Making:  As above.  ____________________________________________  FINAL CLINICAL IMPRESSION(S) / ED DIAGNOSES  Final diagnoses:  Bronchitis  Nasal congestion     MEDICATIONS GIVEN DURING THIS VISIT:  Medications  ondansetron (ZOFRAN-ODT) disintegrating tablet 4 mg (4 mg Oral Given 02/26/19 2254)  doxycycline (VIBRA-TABS) tablet 100 mg (100 mg Oral Given 02/26/19 2254)  oxymetazoline (AFRIN) 0.05 % nasal spray 2 spray (2 sprays Each Nare Given 02/26/19 2254)  dexamethasone (DECADRON) tablet 10 mg (10 mg Oral Given 02/26/19 2348)  cloNIDine (CATAPRES) tablet 0.1 mg (0.1 mg Oral Given 02/27/19 0002)     ED Discharge Orders         Ordered    doxycycline (VIBRAMYCIN) 100 MG capsule  2 times daily     02/26/19 2315    HYDROcodone-homatropine (HYCODAN) 5-1.5 MG/5ML syrup  Every 6 hours PRN     02/26/19 2315    ondansetron (ZOFRAN ODT) 4 MG disintegrating tablet  Every 8 hours PRN     02/26/19 2315           Note:  This document was prepared using Dragon voice recognition software and may include unintentional dictation errors.   Shaune PollackIsaacs, Arkel Cartwright, MD 02/27/19 [id number]

## 2019-02-27 ENCOUNTER — Emergency Department: Payer: BC Managed Care – PPO

## 2019-02-28 LAB — NOVEL CORONAVIRUS, NAA (HOSP ORDER, SEND-OUT TO REF LAB; TAT 18-24 HRS): SARS-CoV-2, NAA: NOT DETECTED

## 2020-11-20 IMAGING — DX PORTABLE CHEST - 1 VIEW
1 series · 1 of 1 positions shown · non-contrast
Comparison: 12/23/2016

CLINICAL DATA: States feels "swimmy headed" since yesterday. States
has had sinus congestion and drainage. Was concerned blood pressure
might be elevated. Denies headache. Chest pain began this morning.
Hx - HTN, current smoker 0.5 ppd.

EXAM:
PORTABLE CHEST 1 VIEW

[chest ap]
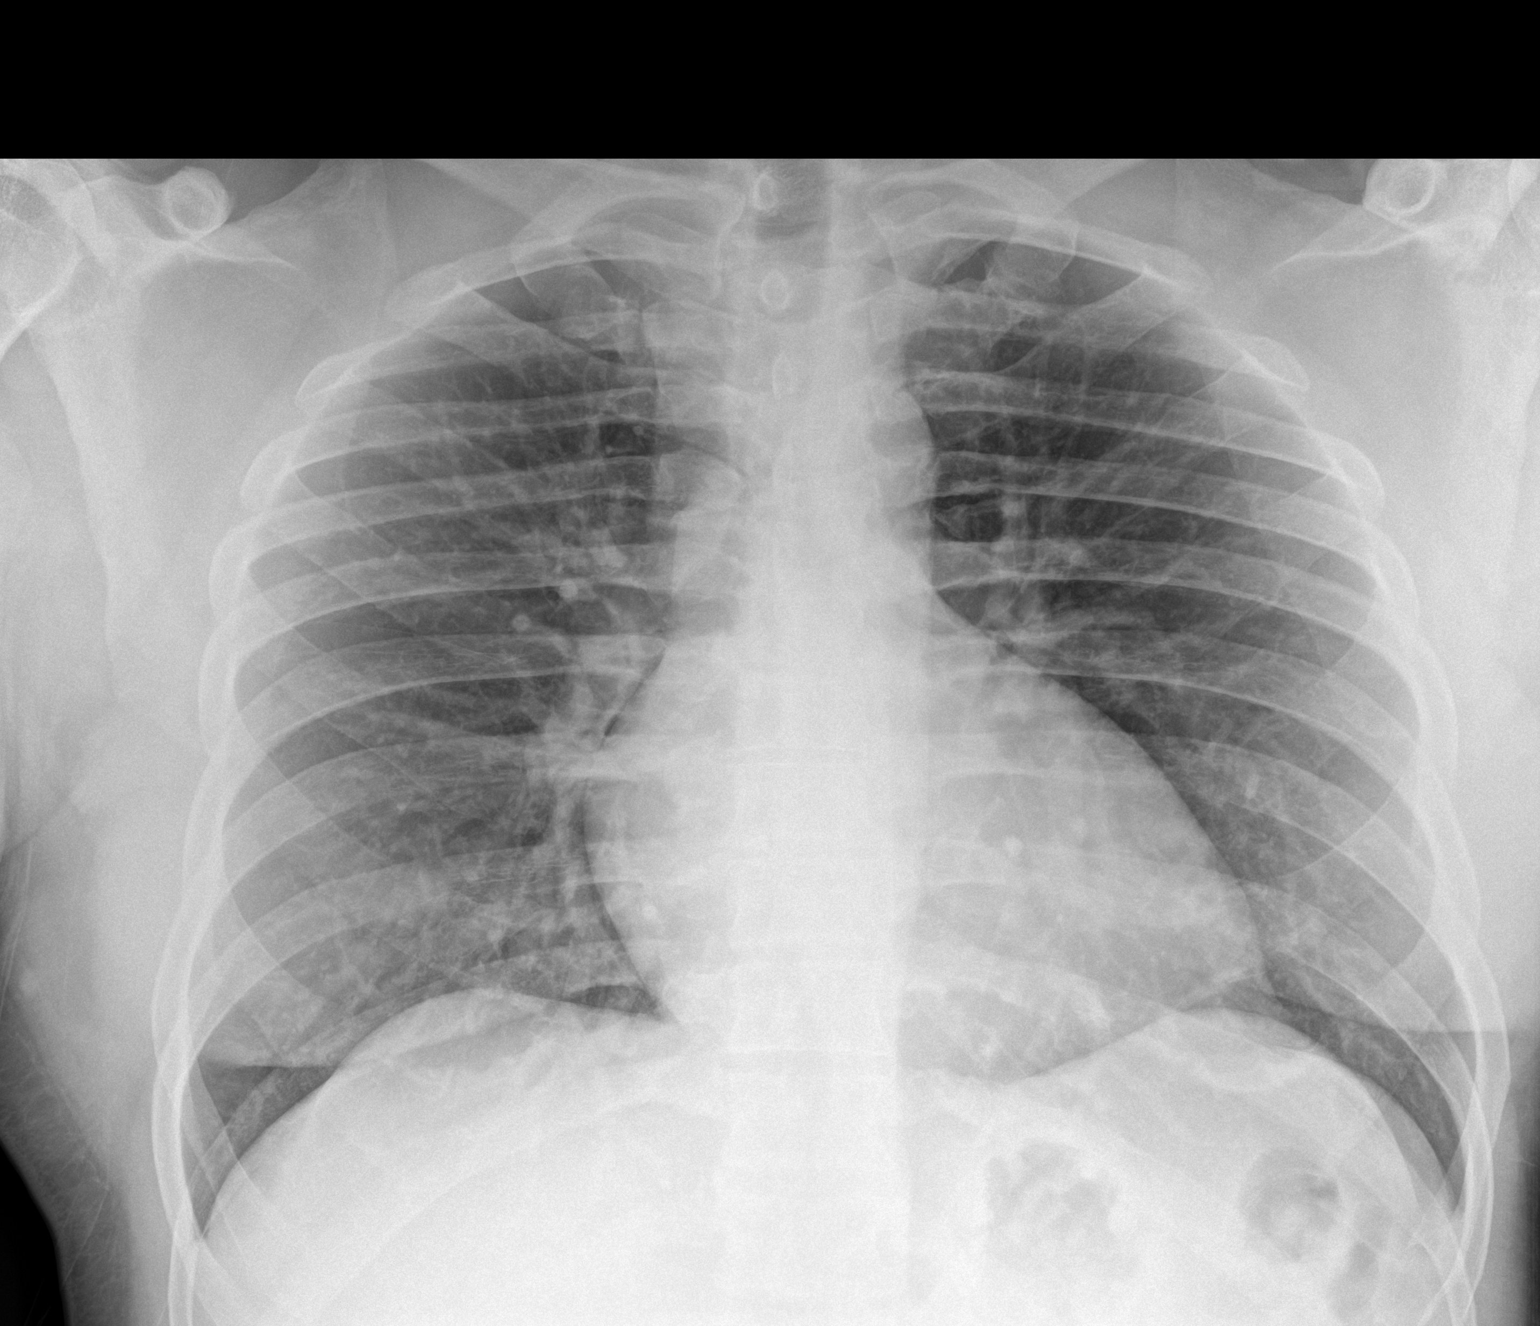

[1 of 1 positions shown; findings below may reference images not displayed]

FINDINGS: Normal heart, mediastinum and hila.

Clear lungs.  No pleural effusion or pneumothorax.

Skeletal structures unremarkable.
IMPRESSION: No active disease.

## 2020-12-13 IMAGING — CR CHEST - 2 VIEW
2 series · 2 of 2 positions shown · non-contrast
Comparison: None.

CLINICAL DATA: Chest pain

EXAM:
CHEST - 2 VIEW

[chest pa]
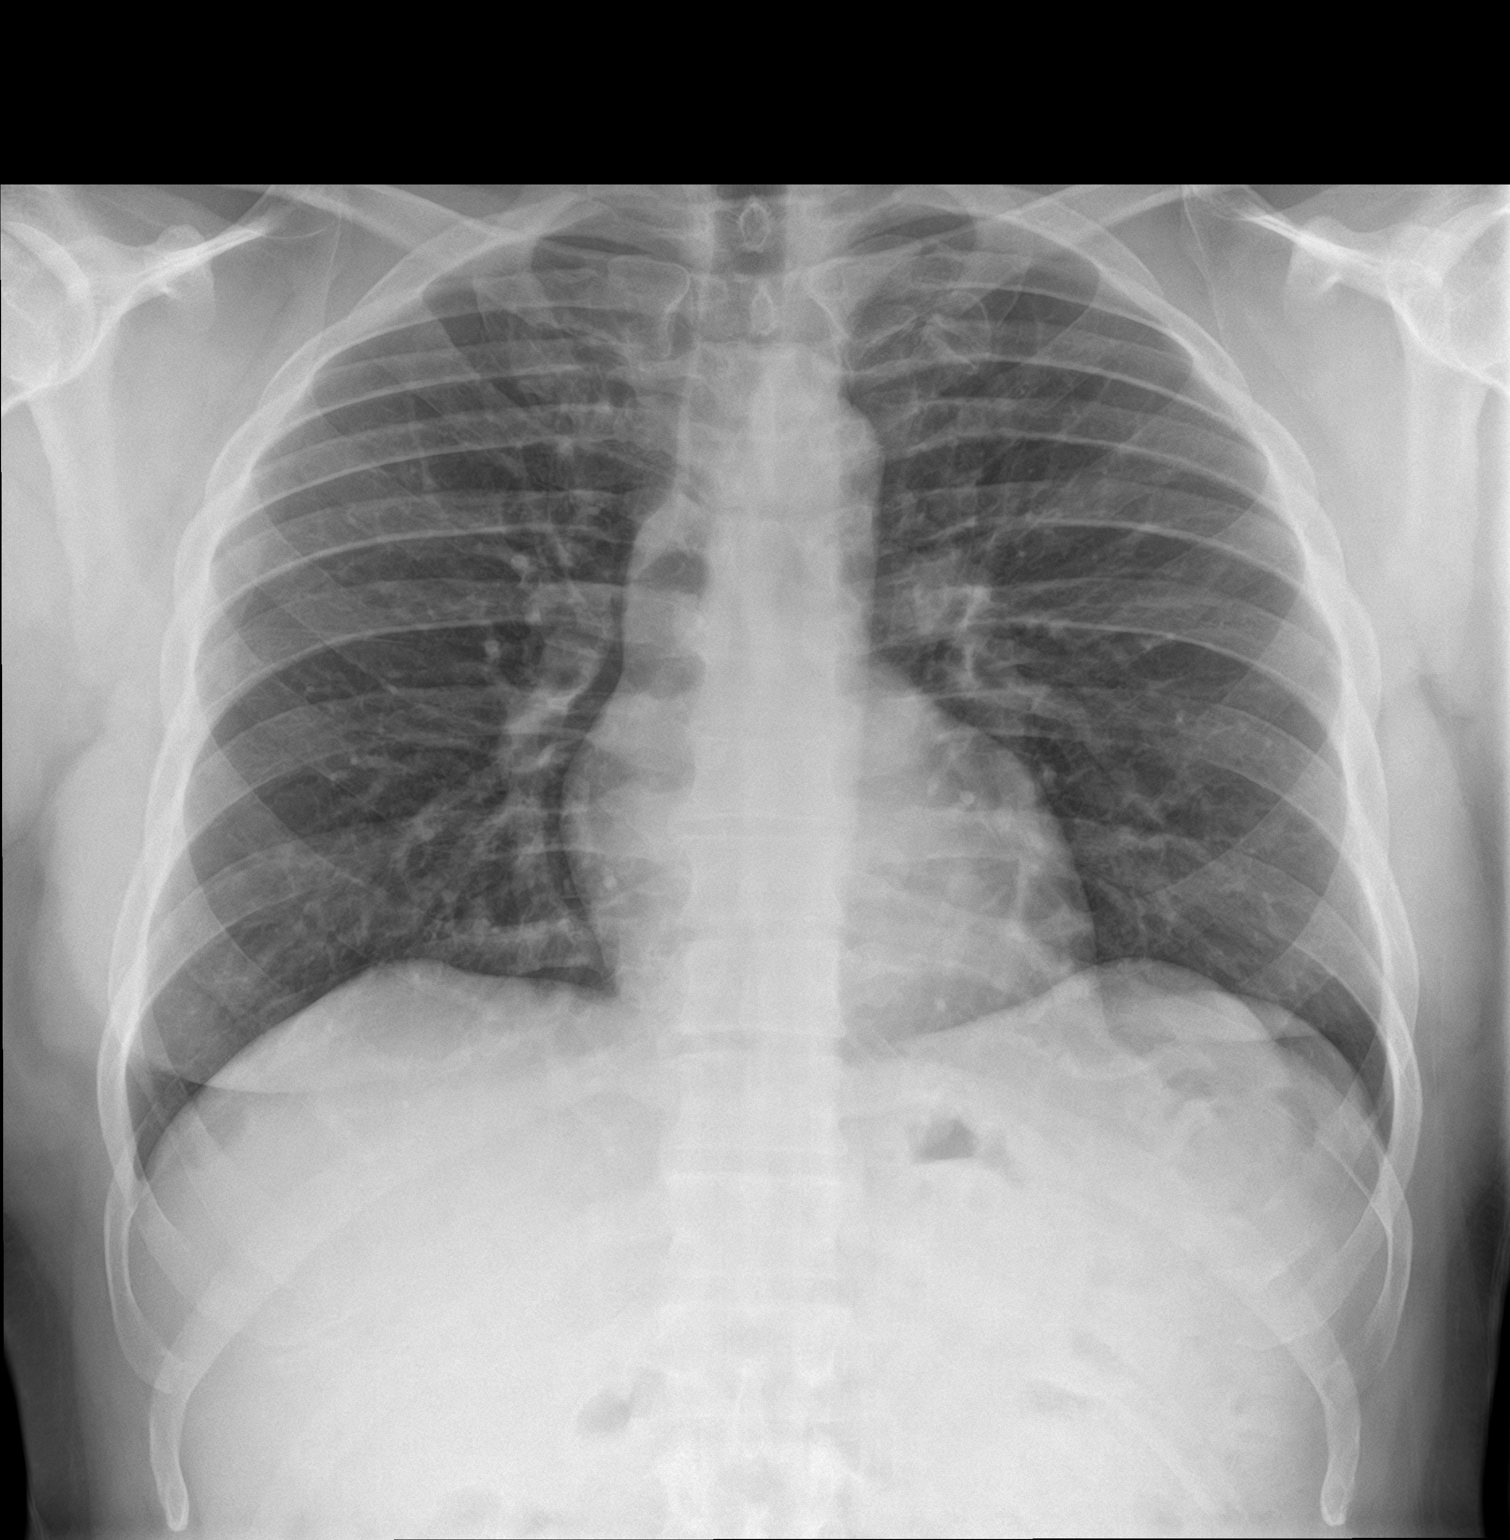

[chest lat]
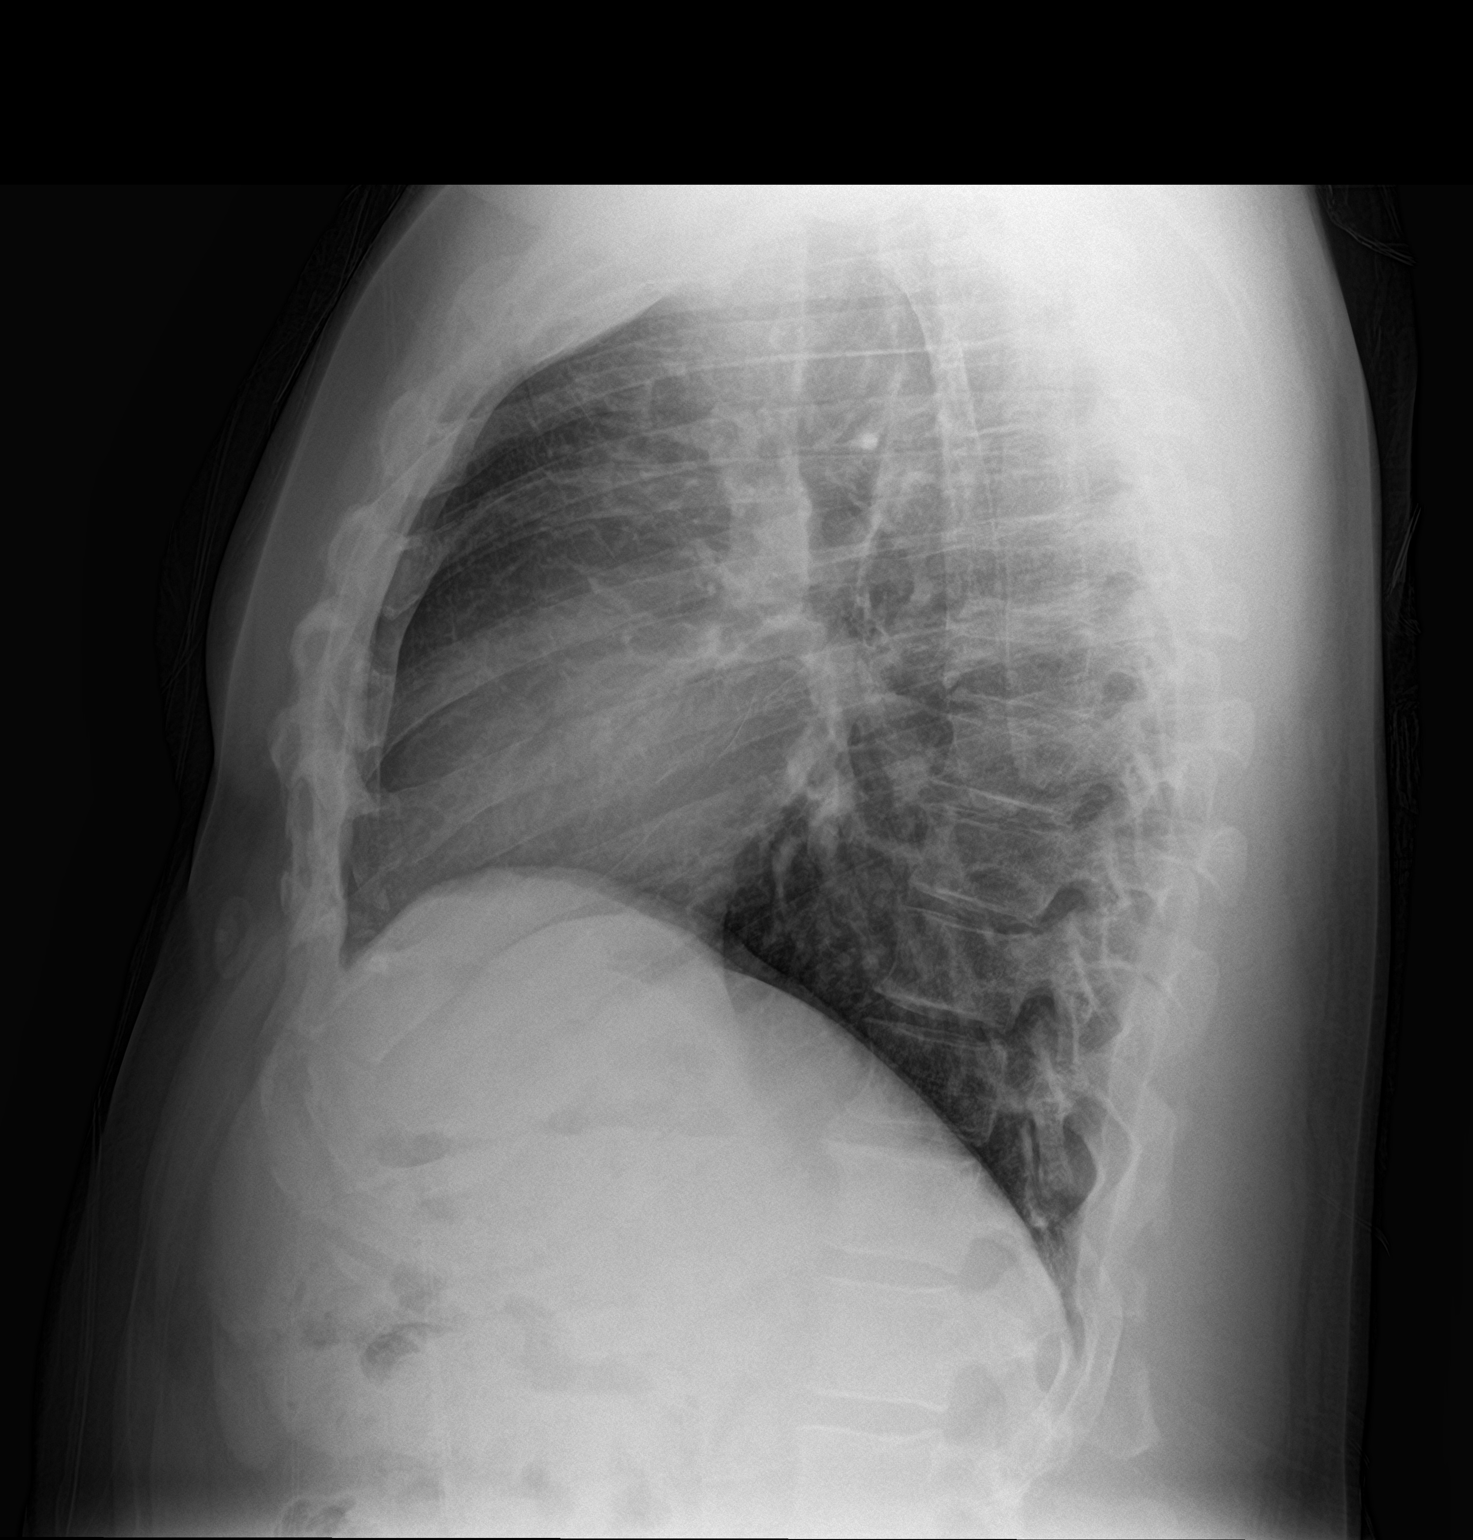

[2 of 2 positions shown; findings below may reference images not displayed]

FINDINGS: The heart size and mediastinal contours are within normal limits.
Both lungs are clear. The visualized skeletal structures are
unremarkable.
IMPRESSION: No active cardiopulmonary disease.

## 2022-05-26 ENCOUNTER — Other Ambulatory Visit: Payer: Self-pay

## 2022-05-26 ENCOUNTER — Emergency Department: Payer: Self-pay

## 2022-05-26 ENCOUNTER — Emergency Department
Admission: EM | Admit: 2022-05-26 | Discharge: 2022-05-27 | Discharge status: Against Medical Advice | Payer: Self-pay | Attending: Emergency Medicine | Admitting: Emergency Medicine

## 2022-05-26 DIAGNOSIS — Z5321 Procedure and treatment not carried out due to patient leaving prior to being seen by health care provider: Secondary | ICD-10-CM | POA: Insufficient documentation

## 2022-05-26 DIAGNOSIS — R079 Chest pain, unspecified: Secondary | ICD-10-CM | POA: Insufficient documentation

## 2022-05-26 DIAGNOSIS — R42 Dizziness and giddiness: Secondary | ICD-10-CM | POA: Insufficient documentation

## 2022-05-26 DIAGNOSIS — R0602 Shortness of breath: Secondary | ICD-10-CM | POA: Insufficient documentation

## 2022-05-26 LAB — PROTIME-INR
INR: 0.9 (ref 0.8–1.2)
Prothrombin Time: 12 seconds (ref 11.4–15.2)

## 2022-05-26 LAB — TROPONIN I (HIGH SENSITIVITY): Troponin I (High Sensitivity): 4 ng/L (ref <18)

## 2022-05-26 LAB — CBC
HCT: 45.5 % (ref 39.0–52.0)
Hemoglobin: 14.8 g/dL (ref 13.0–17.0)
MCH: 28.5 pg (ref 26.0–34.0)
MCHC: 32.5 g/dL (ref 30.0–36.0)
MCV: 87.7 fL (ref 80.0–100.0)
Platelets: 260 10*3/uL (ref 150–400)
RBC: 5.19 MIL/uL (ref 4.22–5.81)
RDW: 12.8 % (ref 11.5–15.5)
WBC: 13.6 10*3/uL — ABNORMAL HIGH (ref 4.0–10.5)
nRBC: 0 % (ref 0.0–0.2)

## 2022-05-26 LAB — BASIC METABOLIC PANEL
Anion gap: 8 (ref 5–15)
BUN: 16 mg/dL (ref 6–20)
CO2: 26 mmol/L (ref 22–32)
Calcium: 9.5 mg/dL (ref 8.9–10.3)
Chloride: 103 mmol/L (ref 98–111)
Creatinine, Ser: 1.06 mg/dL (ref 0.61–1.24)
GFR, Estimated: >60 mL/min (ref >60)
Glucose, Bld: 99 mg/dL (ref 70–99)
Potassium: 3.7 mmol/L (ref 3.5–5.1)
Sodium: 137 mmol/L (ref 135–145)

## 2022-05-26 NOTE — ED Triage Notes (Signed)
Pt in via EMS form home with c/o CP for 1.5 days and sudden onset of NV and dizziness. CP is intermittent and radiates to left shoulder. Pt with hx of HTN and takes meds for it. #18g to left AC.

## 2022-05-26 NOTE — ED Triage Notes (Signed)
Pt called, no reposnse 

## 2022-05-26 NOTE — ED Triage Notes (Signed)
Pt called from [facility] to treatment room, no response 

## 2022-05-26 NOTE — ED Triage Notes (Signed)
Pt states he has had chest pain and dizziness all day today- pt denies cough, fever, congestion- pt does endorse some SHOB- pt states pain is on the L side of his chest
